# Patient Record
Sex: Female | Born: 1955 | Race: Black or African American | Hispanic: No | Marital: Married | Smoking: Never smoker
Health system: Southern US, Community
[De-identification: ages and names within clinical notes are randomized; demographics above are authoritative.]

## PROBLEM LIST (undated history)

## (undated) DIAGNOSIS — E1159 Type 2 diabetes mellitus with other circulatory complications: Secondary | ICD-10-CM

## (undated) DIAGNOSIS — E785 Hyperlipidemia, unspecified: Secondary | ICD-10-CM

## (undated) DIAGNOSIS — F419 Anxiety disorder, unspecified: Secondary | ICD-10-CM

## (undated) DIAGNOSIS — E119 Type 2 diabetes mellitus without complications: Secondary | ICD-10-CM

## (undated) DIAGNOSIS — F329 Major depressive disorder, single episode, unspecified: Secondary | ICD-10-CM

## (undated) DIAGNOSIS — F32A Depression, unspecified: Secondary | ICD-10-CM

## (undated) DIAGNOSIS — E1169 Type 2 diabetes mellitus with other specified complication: Secondary | ICD-10-CM

## (undated) DIAGNOSIS — M1990 Unspecified osteoarthritis, unspecified site: Secondary | ICD-10-CM

## (undated) DIAGNOSIS — I10 Essential (primary) hypertension: Secondary | ICD-10-CM

## (undated) DIAGNOSIS — Z794 Long term (current) use of insulin: Secondary | ICD-10-CM

## (undated) DIAGNOSIS — E1110 Type 2 diabetes mellitus with ketoacidosis without coma: Secondary | ICD-10-CM

## (undated) DIAGNOSIS — I152 Hypertension secondary to endocrine disorders: Secondary | ICD-10-CM

## (undated) HISTORY — DX: Type 2 diabetes mellitus with other circulatory complications: E11.59

## (undated) HISTORY — PX: ABDOMINAL HYSTERECTOMY: SHX81

## (undated) HISTORY — DX: Essential (primary) hypertension: I10

## (undated) HISTORY — DX: Anxiety disorder, unspecified: F41.9

## (undated) HISTORY — DX: Type 2 diabetes mellitus with other specified complication: E11.69

## (undated) HISTORY — DX: Type 2 diabetes mellitus without complications: E11.9

## (undated) HISTORY — PX: HERNIA REPAIR: SHX51

## (undated) HISTORY — DX: Hypertension secondary to endocrine disorders: I15.2

## (undated) HISTORY — DX: Unspecified osteoarthritis, unspecified site: M19.90

## (undated) HISTORY — DX: Type 2 diabetes mellitus with ketoacidosis without coma: E11.10

## (undated) HISTORY — DX: Major depressive disorder, single episode, unspecified: F32.9

## (undated) HISTORY — DX: Depression, unspecified: F32.A

## (undated) HISTORY — DX: Long term (current) use of insulin: E11.9

## (undated) HISTORY — DX: Type 2 diabetes mellitus without complications: Z79.4

## (undated) HISTORY — DX: Type 2 diabetes mellitus with other specified complication: E78.5

---

## 1999-06-11 ENCOUNTER — Encounter: Admission: RE | Admit: 1999-06-11 | Discharge: 1999-06-11 | Payer: Self-pay | Admitting: Cardiology

## 1999-06-11 ENCOUNTER — Encounter: Payer: Self-pay | Admitting: Cardiology

## 1999-07-27 ENCOUNTER — Other Ambulatory Visit: Admission: RE | Admit: 1999-07-27 | Discharge: 1999-07-27 | Payer: Self-pay | Admitting: Obstetrics and Gynecology

## 1999-07-28 ENCOUNTER — Other Ambulatory Visit: Admission: RE | Admit: 1999-07-28 | Discharge: 1999-07-28 | Payer: Self-pay | Admitting: Obstetrics and Gynecology

## 1999-08-03 ENCOUNTER — Encounter: Payer: Self-pay | Admitting: Obstetrics and Gynecology

## 1999-08-03 ENCOUNTER — Ambulatory Visit (HOSPITAL_COMMUNITY): Admission: RE | Admit: 1999-08-03 | Discharge: 1999-08-03 | Payer: Self-pay | Admitting: Obstetrics and Gynecology

## 1999-08-13 ENCOUNTER — Ambulatory Visit (HOSPITAL_COMMUNITY): Admission: RE | Admit: 1999-08-13 | Discharge: 1999-08-13 | Payer: Self-pay | Admitting: General Surgery

## 1999-12-11 ENCOUNTER — Inpatient Hospital Stay (HOSPITAL_COMMUNITY): Admission: AD | Admit: 1999-12-11 | Discharge: 1999-12-12 | Payer: Self-pay | Admitting: Obstetrics and Gynecology

## 2001-01-02 ENCOUNTER — Inpatient Hospital Stay (HOSPITAL_COMMUNITY): Admission: AD | Admit: 2001-01-02 | Discharge: 2001-01-04 | Payer: Self-pay | Admitting: Cardiology

## 2001-01-02 ENCOUNTER — Encounter: Payer: Self-pay | Admitting: Cardiology

## 2001-01-02 ENCOUNTER — Ambulatory Visit (HOSPITAL_COMMUNITY): Admission: RE | Admit: 2001-01-02 | Discharge: 2001-01-02 | Payer: Self-pay | Admitting: Cardiology

## 2001-07-02 ENCOUNTER — Ambulatory Visit (HOSPITAL_COMMUNITY): Admission: RE | Admit: 2001-07-02 | Discharge: 2001-07-02 | Payer: Self-pay | Admitting: General Surgery

## 2002-05-01 ENCOUNTER — Other Ambulatory Visit: Admission: RE | Admit: 2002-05-01 | Discharge: 2002-05-01 | Payer: Self-pay | Admitting: Obstetrics and Gynecology

## 2002-05-02 ENCOUNTER — Encounter (HOSPITAL_BASED_OUTPATIENT_CLINIC_OR_DEPARTMENT_OTHER): Payer: Self-pay | Admitting: General Surgery

## 2002-05-06 ENCOUNTER — Ambulatory Visit (HOSPITAL_COMMUNITY): Admission: RE | Admit: 2002-05-06 | Discharge: 2002-05-06 | Payer: Self-pay | Admitting: General Surgery

## 2002-06-06 ENCOUNTER — Encounter: Payer: Self-pay | Admitting: Obstetrics and Gynecology

## 2002-06-06 ENCOUNTER — Ambulatory Visit (HOSPITAL_COMMUNITY): Admission: RE | Admit: 2002-06-06 | Discharge: 2002-06-06 | Payer: Self-pay | Admitting: Obstetrics and Gynecology

## 2002-08-05 ENCOUNTER — Encounter: Payer: Self-pay | Admitting: Cardiology

## 2002-08-05 ENCOUNTER — Encounter: Admission: RE | Admit: 2002-08-05 | Discharge: 2002-08-05 | Payer: Self-pay | Admitting: Cardiology

## 2002-09-11 ENCOUNTER — Ambulatory Visit (HOSPITAL_BASED_OUTPATIENT_CLINIC_OR_DEPARTMENT_OTHER): Admission: RE | Admit: 2002-09-11 | Discharge: 2002-09-11 | Payer: Self-pay

## 2002-09-23 ENCOUNTER — Ambulatory Visit (HOSPITAL_COMMUNITY): Admission: AD | Admit: 2002-09-23 | Discharge: 2002-09-23 | Payer: Self-pay

## 2002-12-31 ENCOUNTER — Encounter: Admission: RE | Admit: 2002-12-31 | Discharge: 2003-03-31 | Payer: Self-pay | Admitting: Cardiology

## 2003-05-16 ENCOUNTER — Encounter: Admission: RE | Admit: 2003-05-16 | Discharge: 2003-05-16 | Payer: Self-pay | Admitting: Cardiology

## 2004-03-20 ENCOUNTER — Emergency Department (HOSPITAL_COMMUNITY): Admission: EM | Admit: 2004-03-20 | Discharge: 2004-03-20 | Payer: Self-pay | Admitting: *Deleted

## 2005-02-01 ENCOUNTER — Emergency Department (HOSPITAL_COMMUNITY): Admission: EM | Admit: 2005-02-01 | Discharge: 2005-02-01 | Payer: Self-pay | Admitting: Family Medicine

## 2005-02-02 ENCOUNTER — Emergency Department (HOSPITAL_COMMUNITY): Admission: EM | Admit: 2005-02-02 | Discharge: 2005-02-02 | Payer: Self-pay | Admitting: Family Medicine

## 2005-02-04 ENCOUNTER — Encounter: Admission: RE | Admit: 2005-02-04 | Discharge: 2005-02-04 | Payer: Self-pay | Admitting: Cardiology

## 2006-11-06 ENCOUNTER — Emergency Department: Payer: Self-pay | Admitting: Emergency Medicine

## 2009-01-06 ENCOUNTER — Ambulatory Visit: Payer: Self-pay | Admitting: Internal Medicine

## 2009-07-29 ENCOUNTER — Encounter: Payer: Self-pay | Admitting: Rheumatology

## 2009-08-02 ENCOUNTER — Encounter: Payer: Self-pay | Admitting: Rheumatology

## 2009-09-02 ENCOUNTER — Encounter: Payer: Self-pay | Admitting: Rheumatology

## 2010-02-21 ENCOUNTER — Emergency Department: Payer: Self-pay | Admitting: Emergency Medicine

## 2010-04-24 ENCOUNTER — Encounter: Payer: Self-pay | Admitting: Cardiology

## 2010-06-08 ENCOUNTER — Emergency Department: Payer: Self-pay | Admitting: General Practice

## 2010-11-16 ENCOUNTER — Ambulatory Visit: Payer: Self-pay | Admitting: General Practice

## 2011-06-04 ENCOUNTER — Ambulatory Visit: Payer: Self-pay | Admitting: Neurology

## 2011-07-05 ENCOUNTER — Ambulatory Visit: Payer: Self-pay | Admitting: Gastroenterology

## 2012-05-23 ENCOUNTER — Ambulatory Visit: Payer: Self-pay | Admitting: General Practice

## 2012-11-27 ENCOUNTER — Ambulatory Visit: Payer: Self-pay | Admitting: General Practice

## 2013-11-23 ENCOUNTER — Emergency Department: Payer: Self-pay | Admitting: Emergency Medicine

## 2013-11-23 LAB — URINALYSIS, COMPLETE
BILIRUBIN, UR: NEGATIVE
BLOOD: NEGATIVE
Bacteria: NONE SEEN
Glucose,UR: >=500 mg/dL (ref 0–75)
Ketone: NEGATIVE
Nitrite: NEGATIVE
Ph: 7 (ref 4.5–8.0)
Protein: 100
RBC,UR: NONE SEEN /HPF (ref 0–5)
Specific Gravity: 1.011 (ref 1.003–1.030)

## 2013-11-23 LAB — BASIC METABOLIC PANEL
ANION GAP: 12 (ref 7–16)
BUN: 26 mg/dL — ABNORMAL HIGH (ref 7–18)
CALCIUM: 9.3 mg/dL (ref 8.5–10.1)
CO2: 27 mmol/L (ref 21–32)
Chloride: 98 mmol/L (ref 98–107)
Creatinine: 1.56 mg/dL — ABNORMAL HIGH (ref 0.60–1.30)
EGFR (Non-African Amer.): 37 — ABNORMAL LOW
GFR CALC AF AMER: 43 — AB
Glucose: 423 mg/dL — ABNORMAL HIGH (ref 65–99)
OSMOLALITY: 297 (ref 275–301)
POTASSIUM: 3 mmol/L — AB (ref 3.5–5.1)
SODIUM: 137 mmol/L (ref 136–145)

## 2013-11-23 LAB — CBC
HCT: 43.3 % (ref 35.0–47.0)
HGB: 13.7 g/dL (ref 12.0–16.0)
MCH: 26.2 pg (ref 26.0–34.0)
MCHC: 31.8 g/dL — AB (ref 32.0–36.0)
MCV: 83 fL (ref 80–100)
Platelet: 433 10*3/uL (ref 150–440)
RBC: 5.24 10*6/uL — ABNORMAL HIGH (ref 3.80–5.20)
RDW: 15.4 % — AB (ref 11.5–14.5)
WBC: 11.2 10*3/uL — AB (ref 3.6–11.0)

## 2013-11-23 LAB — MAGNESIUM: MAGNESIUM: 2.2 mg/dL

## 2014-01-09 DIAGNOSIS — E119 Type 2 diabetes mellitus without complications: Secondary | ICD-10-CM | POA: Insufficient documentation

## 2014-01-09 DIAGNOSIS — E1169 Type 2 diabetes mellitus with other specified complication: Secondary | ICD-10-CM | POA: Insufficient documentation

## 2014-01-09 DIAGNOSIS — I471 Supraventricular tachycardia: Secondary | ICD-10-CM | POA: Insufficient documentation

## 2014-01-09 DIAGNOSIS — IMO0002 Reserved for concepts with insufficient information to code with codable children: Secondary | ICD-10-CM | POA: Insufficient documentation

## 2014-01-09 HISTORY — DX: Type 2 diabetes mellitus without complications: E11.9

## 2014-06-04 ENCOUNTER — Ambulatory Visit: Payer: Self-pay | Admitting: Endocrinology

## 2014-10-21 ENCOUNTER — Encounter: Payer: Self-pay | Admitting: Family Medicine

## 2014-10-21 ENCOUNTER — Other Ambulatory Visit (INDEPENDENT_AMBULATORY_CARE_PROVIDER_SITE_OTHER): Payer: PRIVATE HEALTH INSURANCE

## 2014-10-21 ENCOUNTER — Ambulatory Visit (INDEPENDENT_AMBULATORY_CARE_PROVIDER_SITE_OTHER): Payer: PRIVATE HEALTH INSURANCE | Admitting: Family Medicine

## 2014-10-21 ENCOUNTER — Ambulatory Visit (INDEPENDENT_AMBULATORY_CARE_PROVIDER_SITE_OTHER)
Admission: RE | Admit: 2014-10-21 | Discharge: 2014-10-21 | Disposition: A | Discharge status: Home / Self Care | Payer: PRIVATE HEALTH INSURANCE | Source: Ambulatory Visit | Attending: Family Medicine | Admitting: Family Medicine

## 2014-10-21 VITALS — BP 134/82 | HR 77 | Wt 197.0 lb

## 2014-10-21 DIAGNOSIS — R29898 Other symptoms and signs involving the musculoskeletal system: Secondary | ICD-10-CM | POA: Diagnosis not present

## 2014-10-21 DIAGNOSIS — M25511 Pain in right shoulder: Secondary | ICD-10-CM

## 2014-10-21 DIAGNOSIS — M75101 Unspecified rotator cuff tear or rupture of right shoulder, not specified as traumatic: Secondary | ICD-10-CM | POA: Insufficient documentation

## 2014-10-21 LAB — SEDIMENTATION RATE: SED RATE: 35 mm/h — AB (ref 0–22)

## 2014-10-21 LAB — C-REACTIVE PROTEIN: CRP: 0.5 mg/dL (ref 0.5–20.0)

## 2014-10-21 MED ORDER — MELOXICAM 15 MG PO TABS: 15.0000 mg | ORAL_TABLET | Freq: Every day | ORAL | Status: DC

## 2014-10-21 MED ORDER — GABAPENTIN 100 MG PO CAPS: 200.0000 mg | ORAL_CAPSULE | Freq: Every day | ORAL | Status: DC

## 2014-10-21 NOTE — Assessment & Plan Note (Addendum)
She does have significant weakness of the right arm. It does appear the patient does have a rotator cuff tear that could be contributing to it. Patient also though is having shaking of the arm and seems to have some mild neurologic component to it. Brachial neuritis, cervical radiculopathy, are within the differential. At this point we will get labs for workup to rule out any autoimmune disease or any inflammatory markers. X-ray of patient's shoulder and neck also ordered today to rule out any other bony abnormality I could be contribute in. Patient given gabapentin and meloxicam. We discussed prednisone but due to patient's blood sugars we went to avoid this. With the amount weakness the patient is having a do feel that advance imaging is warranted. She'll have an MRI of the shoulder as well as the neck. Patient's x-rays of her shoulder do show that there is a loose body that could also be contributing. Depending on findings we will discuss further follow-up.

## 2014-10-21 NOTE — Progress Notes (Signed)
Tawana ScaleZach Lissa Rowles D.O. [facility] [address] [place], [place] [address] Phone: [phone] Subjective:    CC: Right shoulder pain  BJY:[id number]:Subjective Lauren LarkGrace M Mueller is a 59 y.o. female coming in with complaint of right shoulder pain. Patient states that she has had this pain for 5-6 months. Patient states there may have been an injury while she was at work. Patient does work in a [employer]. Patient states she has had unfortunate pain more on the right arm as well as the right shoulder. Patient has noticed some mild weakness and has decreased range of motion of the shoulder. Patient states that certain movements and give her a sharp pain. Has noticed some numbness when she wakes up in the morning of the right arm as well. In addition of this patient has started noticing weakness in the hand especially with grasping things. Patient likely has been able to work because she is more of a Merchandiser, retailsupervisor and has not been doing any lifting at this time. Patient was given prednisone by primary care provider which did seem to help out significantly but unfortunately when she stopped the pain as well as the numbness came back. Patient is also noticed a tremor in the arm. Patient is becoming more and more concerned and states that this is starting to affect her daily activities. Rates the severity of pain is 8 out of 10 when it occurs.   Patient Active Problem List   Diagnosis Date Noted  . Right arm weakness 10/21/2014  . Type 2 diabetes mellitus with other specified complication 01/09/2014  . Adult BMI 30+ 01/09/2014  . Supraventricular tachycardia 01/09/2014  . Diabetes mellitus, type 2 01/09/2014    No past medical history on file. No past surgical history on file. History  Substance Use Topics  . Smoking status: Not on file  . Smokeless tobacco: Not on file  . Alcohol Use: Not on file   Not on File No family history on file. patient states that family history of rheumatoid  arthritis      Past medical history, social, surgical and family history all reviewed in electronic medical record.   Review of Systems: No headache, visual changes, nausea, vomiting, diarrhea, constipation, dizziness, abdominal pain, skin rash, fevers, chills, night sweats, weight loss, swollen lymph nodes, body aches, joint swelling, muscle aches, chest pain, shortness of breath, mood changes.   Objective Blood pressure 134/82, pulse 77, weight 197 lb (89.359 kg), SpO2 99 %.  General: No apparent distress alert and oriented x3 mood and affect normal, dressed appropriately. Obese HEENT: Pupils equal, extraocular movements intact  Respiratory: Patient's speak in full sentences and does not appear short of breath  Cardiovascular: No lower extremity edema, non tender, no erythema  Skin: Warm dry intact with no signs of infection or rash on extremities or on axial skeleton.  Abdomen: Soft nontender  Neuro: Cranial nerves II through XII are intact, neurovascularly intact in all extremities with 2+ DTRs and 2+ pulses.  Lymph: No lymphadenopathy of posterior or anterior cervical chain or axillae bilaterally.  Gait normal with good balance and coordination.  MSK:  Non tender with full range of motion and good stability and symmetric strength and tone of  elbows, wrist, hip, knee and ankles bilaterally.  Neck: Inspection unremarkable. No palpable stepoffs. Negative Spurling's maneuver. Full neck range of motion Grip strength and sensation normal in bilateral hands Significant loss of strength at C5-C7 with 3 out of 5 strength compared to the contralateral  side. Mild numbness from C5-T1 Negative Hoffman sign bilaterally +1 brachioradialis and biceps tendon compared to the contralateral side.  Shoulder: Right Inspection reveals no abnormalities, atrophy or asymmetry. Palpation is normal with no tenderness over AC joint or bicipital groove. ROM is full in all planes passively. Rotator cuff  strength normal throughout. signs of impingement with positive Neer and Hawkin's tests, but negative empty can sign. Speeds and Yergason's tests normal. No labral pathology noted with negative Obrien's, negative clunk and good stability. Normal scapular function observed. No painful arc and no drop arm sign. No apprehension sign  MSK US performed of: Right This study was ordered, performed, and interpreted by Terrilee Files D.O.  Shoulder:   Supraspinatus:  Appears patient may have a full-thickness tear or near full-thickness tear noted. Significant hypoechoic changes make it difficult to assess appropriately. Mild retraction noted of at least greater than 50% of the tendon. 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 AC joint:  Moderate arthritis Glenohumeral Joint:  Appears normal without effusion. Glenoid Labrum:  Degenerative changes noted Biceps Tendon:  Appears normal on long and transverse views, no fraying of tendon, tendon located in intertubercular groove, no subluxation with shoulder internal or external rotation.  Impression: Rotator cuff tear noted   Impression and Recommendations:     This case required medical decision making of moderate complexity.

## 2014-10-21 NOTE — Patient Instructions (Signed)
Good to see you.  Ice 20 minutes 2 times daily. Usually after activity and before bed. Gabapentin  at night meloxicam daily for 10 days then as needed Xrays downstairs today Labs today.  See me again next week and if no improvement we need MRI

## 2014-10-22 LAB — ANGIOTENSIN CONVERTING ENZYME: ANGIOTENSIN-CONVERTING ENZYME: 32 U/L (ref 8–52)

## 2014-10-22 LAB — ANA: ANA: NEGATIVE

## 2014-10-22 LAB — CYCLIC CITRUL PEPTIDE ANTIBODY, IGG: Cyclic Citrullin Peptide Ab: <2 U/mL (ref 0.0–5.0)

## 2014-10-29 ENCOUNTER — Ambulatory Visit
Admission: RE | Admit: 2014-10-29 | Discharge: 2014-10-29 | Disposition: A | Discharge status: Home / Self Care | Payer: PRIVATE HEALTH INSURANCE | Source: Ambulatory Visit | Attending: Family Medicine | Admitting: Family Medicine

## 2014-10-29 DIAGNOSIS — M75121 Complete rotator cuff tear or rupture of right shoulder, not specified as traumatic: Secondary | ICD-10-CM | POA: Diagnosis not present

## 2014-10-29 DIAGNOSIS — M5021 Other cervical disc displacement,  high cervical region: Secondary | ICD-10-CM | POA: Diagnosis not present

## 2014-10-29 DIAGNOSIS — M659 Synovitis and tenosynovitis, unspecified: Secondary | ICD-10-CM | POA: Insufficient documentation

## 2014-10-29 DIAGNOSIS — R29898 Other symptoms and signs involving the musculoskeletal system: Secondary | ICD-10-CM | POA: Diagnosis present

## 2014-10-29 DIAGNOSIS — M19011 Primary osteoarthritis, right shoulder: Secondary | ICD-10-CM | POA: Diagnosis not present

## 2014-10-30 ENCOUNTER — Ambulatory Visit (INDEPENDENT_AMBULATORY_CARE_PROVIDER_SITE_OTHER): Payer: PRIVATE HEALTH INSURANCE | Admitting: Family Medicine

## 2014-10-30 ENCOUNTER — Encounter: Payer: Self-pay | Admitting: Family Medicine

## 2014-10-30 VITALS — BP 126/80 | HR 79 | Wt 197.0 lb

## 2014-10-30 DIAGNOSIS — M5412 Radiculopathy, cervical region: Secondary | ICD-10-CM | POA: Insufficient documentation

## 2014-10-30 DIAGNOSIS — M75101 Unspecified rotator cuff tear or rupture of right shoulder, not specified as traumatic: Secondary | ICD-10-CM | POA: Diagnosis not present

## 2014-10-30 DIAGNOSIS — M542 Cervicalgia: Secondary | ICD-10-CM

## 2014-10-30 NOTE — Progress Notes (Signed)
Lauren Mueller [address] [place], [place] [address] Phone: [phone] Subjective:    CC: Right shoulder pain  [name] Lauren Mueller is a 59 y.o. female coming in with complaint of right shoulder pain. This is been going on for 5-6 months. Patient was found to have a rotator cuff tear on ultrasound and was sent for an MRI that has confirmed this with a full-thickness tear of the supraspinatus with retraction. Patient is also having neck pain as well as some weakness of C5-C7. There is concern of some cervical radiculopathy could also be playing a role. Patient and MRI of the neck that did show patient having C6 and C7 nerve root impingement as well as spinal stenosis at C5-C6 that is multifactorial.   Patient Active Problem List   Diagnosis Date Noted  . Right arm weakness 10/21/2014  . Right rotator cuff tear 10/21/2014  . Type 2 diabetes mellitus with other specified complication 01/09/2014  . Adult BMI 30+ 01/09/2014  . Supraventricular tachycardia 01/09/2014  . Diabetes mellitus, type 2 01/09/2014    No past medical history on file. No past surgical history on file. History  Substance Use Topics  . Smoking status: Never Smoker   . Smokeless tobacco: Not on file  . Alcohol Use: Not on file   Not on File No family history on file. patient states that family history of rheumatoid arthritis      Past medical history, social, surgical and family history all reviewed in electronic medical record.   Review of Systems: No headache, visual changes, nausea, vomiting, diarrhea, constipation, dizziness, abdominal pain, skin rash, fevers, chills, night sweats, weight loss, swollen lymph nodes, body aches, joint swelling, muscle aches, chest pain, shortness of breath, mood changes.   Objective Blood pressure 126/80, pulse 79, weight 197 lb (89.359 kg), SpO2 98 %.  General: No apparent distress alert and oriented x3 mood and affect normal, dressed  appropriately. Obese HEENT: Pupils equal, extraocular movements intact  Respiratory: Patient's speak in full sentences and does not appear short of breath  Cardiovascular: No lower extremity edema, non tender, no erythema  Skin: Warm dry intact with no signs of infection or rash on extremities or on axial skeleton.  Abdomen: Soft nontender  Neuro: Cranial nerves II through XII are intact, neurovascularly intact in all extremities with 2+ DTRs and 2+ pulses.  Lymph: No lymphadenopathy of posterior or anterior cervical chain or axillae bilaterally.  Gait normal with good balance and coordination.  MSK:  Non tender with full range of motion and good stability and symmetric strength and tone of  elbows, wrist, hip, knee and ankles bilaterally.  Neck: Inspection unremarkable. No palpable stepoffs. Negative Spurling's maneuver. Full neck range of motion Grip strength and sensation normal in bilateral hands Significant loss of strength at C5-C7 with 3 out of 5 strength compared to the contralateral side. Mild numbness from C5-T1 Negative Hoffman sign bilaterally +1 brachioradialis and biceps tendon compared to the contralateral side.  Shoulder: Right Inspection reveals no abnormalities, atrophy or asymmetry. Palpation is normal with no tenderness over AC joint or bicipital groove. ROM is full in all planes passively. Rotator cuff strength 3 out of 5 compared to full on the contralateral side signs of impingement with positive Neer and Hawkin's tests, but negative empty can sign. Speeds and Yergason's tests normal. No labral pathology noted with negative Obrien's, negative clunk and good stability. Normal scapular function observed. Positive drop arm test No apprehension sign  Impression and Recommendations:     This case required medical decision making of moderate complexity.

## 2014-10-30 NOTE — Assessment & Plan Note (Signed)
Patient does have significant radiculopathy and weakness of the upper extremity on the right side. Some of his weakness is secondary to the rotator cuff tear but I do feel that some of it is a nerve root impingement. I did not feel that EMG is warranted at this time. Patient will be set up for possible epidural of the cervical spine to see if this will help at least temporizing measure. Patient will be referred to neurosurgery for possible surgical intervention as well.  Spent  25 minutes with patient face-to-face and had greater than 50% of counseling including as described above in assessment and plan.

## 2014-10-30 NOTE — Assessment & Plan Note (Signed)
Patient does have a full-thickness tear that I do not think is going to heal appropriately. Patient will be referred to orthopedic surgery for further evaluation for surgical management. Due to patient's weakness of the C5 and C6 nerve roots on exam I am concerned that several radiculopathy is also playing a role and may be a more pressing issue. Patient will meet with the orthopedic surgeon in the near future. Patient can continue the attempts amatory's, icing protocol, and gabapentin the interim. Patient will call if she has any questions otherwise.

## 2014-10-30 NOTE — Progress Notes (Signed)
Pre visit review using our clinic review tool, if applicable. No additional management support is needed unless otherwise documented below in the visit note. 

## 2014-10-30 NOTE — Patient Instructions (Signed)
Good to see you Ice is your friend Continue the gabapentin Dr. Ave Filter for shoulder Dr. Yetta Barre or Dutch Quint for your neck and neurosurgery I am here for any questions.

## 2014-11-18 ENCOUNTER — Ambulatory Visit
Admission: RE | Admit: 2014-11-18 | Discharge: 2014-11-18 | Disposition: A | Discharge status: Home / Self Care | Payer: PRIVATE HEALTH INSURANCE | Source: Ambulatory Visit | Attending: Family Medicine | Admitting: Family Medicine

## 2014-11-18 DIAGNOSIS — M542 Cervicalgia: Secondary | ICD-10-CM

## 2014-11-18 MED ORDER — TRIAMCINOLONE ACETONIDE 40 MG/ML IJ SUSP (RADIOLOGY)
60.0000 mg | Freq: Once | INTRAMUSCULAR | Status: AC
  Administered 2014-11-18: 60 mg via EPIDURAL

## 2014-11-18 MED ORDER — IOHEXOL 300 MG/ML  SOLN
1.0000 mL | Freq: Once | INTRAMUSCULAR | Status: DC | PRN
  Administered 2014-11-18: 1 mL via EPIDURAL

## 2014-11-18 NOTE — Discharge Instructions (Signed)

## 2014-12-11 ENCOUNTER — Other Ambulatory Visit: Payer: Self-pay | Admitting: Physician Assistant

## 2014-12-11 DIAGNOSIS — Z1231 Encounter for screening mammogram for malignant neoplasm of breast: Secondary | ICD-10-CM

## 2014-12-18 ENCOUNTER — Ambulatory Visit: Payer: PRIVATE HEALTH INSURANCE | Attending: Physician Assistant

## 2015-01-05 ENCOUNTER — Encounter: Payer: Self-pay | Admitting: Physician Assistant

## 2015-01-05 ENCOUNTER — Ambulatory Visit: Payer: Self-pay | Admitting: Physician Assistant

## 2015-01-05 VITALS — BP 140/80 | HR 74 | Temp 98.5°F

## 2015-01-05 DIAGNOSIS — S61219A Laceration without foreign body of unspecified finger without damage to nail, initial encounter: Secondary | ICD-10-CM

## 2015-01-05 MED ORDER — CEPHALEXIN 500 MG PO CAPS: 500.0000 mg | ORAL_CAPSULE | Freq: Three times a day (TID) | ORAL | Status: DC

## 2015-01-05 MED ORDER — FLUCONAZOLE 150 MG PO TABS: 150.0000 mg | ORAL_TABLET | Freq: Once | ORAL | Status: DC

## 2015-01-05 MED ORDER — TETANUS-DIPHTH-ACELL PERTUSSIS 5-2.5-18.5 LF-MCG/0.5 IM SUSP
0.5000 mL | Freq: Once | INTRAMUSCULAR | Status: AC
  Administered 2015-01-05: 0.5 mL via INTRAMUSCULAR

## 2015-01-05 NOTE — Progress Notes (Signed)
S:  C/o laceration to left index finger, happened last night >8 hours ago, area still bleeding, was cutting cabbage and knife slipped, tdap is > 5 year, no loss of motion of finger, ros neg  O: vitals wnl, nad, lungs c t a, cv rrr, laceration to left index finger across distal and nail, small amount of bleeding noted, full rom of finger, n/v intact Procedure note: cleaned with sterile saline, area dried with gauze, applied steri strips and bandage, pt tolerated procedure well  A: laceration to left index finger with steri strip closure  P: keflex  tid x 7d, otc pain meds, keep finger as dry as possible, instructed patient and husband on how to use steri strips bc will need to be reapplied, return if any sign of infection or increased pain, if area not healing by Friday, pt instructed to return to clinic as do not feel she should be at work with open wound

## 2015-01-09 ENCOUNTER — Encounter: Payer: Self-pay | Admitting: Physician Assistant

## 2015-01-09 ENCOUNTER — Ambulatory Visit: Payer: Self-pay | Admitting: Physician Assistant

## 2015-01-09 VITALS — BP 130/90 | HR 73 | Temp 98.4°F

## 2015-01-09 DIAGNOSIS — S61221D Laceration with foreign body of left index finger without damage to nail, subsequent encounter: Secondary | ICD-10-CM

## 2015-01-09 NOTE — Progress Notes (Signed)
   Subjective:    Patient ID: Lauren Mueller, female    DOB: 07-14-1955, 59 y.o.   MRN: [id number]  HPI Patient here for follow up laceration left 2nd finger.  Seen previous but wound was too old to suture.  Patient wound was clean and bandage and place on Bactrim DS. States wound is still erythematous and edematous. Denies loss of function or sensation. Patient statres pain is 5/10/    Review of Systems    Hypertension, Diabetes, and GERD Objective:   Physical Exam No acute distress, healing laceration left index finger.  Mild edema and erythema. No visible discharge.N/V intact.         Assessment & Plan:  Healing left index finger laceration.  Continue previous medication and start Tramadol and Ibuprofen as directed. Follow up in 3 days .  Work note given.

## 2015-01-12 ENCOUNTER — Encounter: Payer: Self-pay | Admitting: Physician Assistant

## 2015-01-12 ENCOUNTER — Ambulatory Visit: Payer: Self-pay | Admitting: Physician Assistant

## 2015-01-12 VITALS — BP 118/70 | HR 77 | Temp 98.0°F

## 2015-01-12 DIAGNOSIS — B373 Candidiasis of vulva and vagina: Secondary | ICD-10-CM

## 2015-01-12 DIAGNOSIS — B3731 Acute candidiasis of vulva and vagina: Secondary | ICD-10-CM

## 2015-01-12 MED ORDER — FLUCONAZOLE 150 MG PO TABS: 150.0000 mg | ORAL_TABLET | Freq: Once | ORAL | Status: DC

## 2015-01-12 MED ORDER — TETANUS-DIPHTH-ACELL PERTUSSIS 5-2.5-18.5 LF-MCG/0.5 IM SUSP: 0.5000 mL | Freq: Once | INTRAMUSCULAR | Status: DC

## 2015-01-12 NOTE — Progress Notes (Signed)
S: recheck of finger, wound healing but still open , no fever/chills  O: afebrile, nad, skin with healing wound , serous drainage, n/v intact, no sign of infection  A:  Wound recheck  P: desk duty for 1 week, diflucan  , return if not healed in 1 week

## 2015-01-12 NOTE — Addendum Note (Signed)
Addended by: Catha Brow T on: 01/12/2015 03:15 PM   Modules accepted: Orders

## 2015-01-12 NOTE — Addendum Note (Signed)
Addended by: Catha Brow T on: 01/12/2015 03:34 PM   Modules accepted: Orders

## 2015-02-18 ENCOUNTER — Ambulatory Visit: Payer: Self-pay | Admitting: Family

## 2015-02-18 VITALS — BP 130/89 | HR 76 | Temp 97.8°F

## 2015-02-18 DIAGNOSIS — L03019 Cellulitis of unspecified finger: Secondary | ICD-10-CM

## 2015-02-18 DIAGNOSIS — M545 Low back pain: Secondary | ICD-10-CM

## 2015-02-18 DIAGNOSIS — N390 Urinary tract infection, site not specified: Secondary | ICD-10-CM

## 2015-02-18 LAB — POCT URINALYSIS DIPSTICK
BILIRUBIN UA: NEGATIVE
GLUCOSE UA: NEGATIVE
KETONES UA: NEGATIVE
Nitrite, UA: NEGATIVE
Protein, UA: NEGATIVE
RBC UA: NEGATIVE
SPEC GRAV UA: 1.015
Urobilinogen, UA: 0.2
pH, UA: 6.5

## 2015-02-18 MED ORDER — FLUCONAZOLE 150 MG PO TABS: 150.0000 mg | ORAL_TABLET | Freq: Once | ORAL | Status: DC

## 2015-02-18 MED ORDER — SULFAMETHOXAZOLE-TRIMETHOPRIM 800-160 MG PO TABS: 1.0000 | ORAL_TABLET | Freq: Two times a day (BID) | ORAL | Status: DC

## 2015-02-18 NOTE — Progress Notes (Signed)
S/ low back pain, stiffness , low abd pressure without GU sxs,   denies red flags,  Pain and swelling above laceration site   O/ VSS NAD, u/a with tr wbc Lumbar spine and paraspinous muscles tender full ROM  Abd SP tenderness   A/ LBP  UTI  cellulits P septra ds bid x 10 , Force po fluids . Back stretches and core work  Aleve 2 bid prn Soak finger in epsom salts f/u prn not improving

## 2015-05-08 ENCOUNTER — Other Ambulatory Visit: Payer: Self-pay | Admitting: Physician Assistant

## 2015-05-08 ENCOUNTER — Telehealth: Payer: Self-pay | Admitting: Family Medicine

## 2015-05-08 NOTE — Telephone Encounter (Signed)
[employer] sent request for disability.  Would like to know if it was received.

## 2015-05-11 NOTE — Telephone Encounter (Signed)
Pt called back and she will have [employer] re fax disability forms.

## 2015-05-14 ENCOUNTER — Telehealth: Payer: Self-pay | Admitting: Family Medicine

## 2015-05-14 NOTE — Telephone Encounter (Signed)
Called pt, advised her we still have not received those forms. i provided her with a different fax # in the office to see if this helps. Pt stated she is going to call [employer] & have them re-send the forms.

## 2015-05-14 NOTE — Telephone Encounter (Signed)
Wants to know if you have received information from [employer]

## 2015-05-14 NOTE — Telephone Encounter (Signed)
Duplicate phone note.

## 2015-05-18 ENCOUNTER — Telehealth: Payer: Self-pay | Admitting: Family Medicine

## 2015-05-18 NOTE — Telephone Encounter (Signed)
States disability fax was sent to fax number you gave patient.  Patient is requesting follow up call.

## 2015-05-20 NOTE — Telephone Encounter (Signed)
Spoke to pt & advised her we still have not received the paperwork. She gave me [employer]'s phone # so I could verify with them that they have the correct fax #. I called [employer] they do have our correct fax #. They stated they are waiting on the authorization form from the pt to give them permission to fax [place]. I called the pt back & gave her this information, she stated she has already sent the form several times. Pt stated she will call them again & re-submit the authorization form.

## 2015-05-20 NOTE — Telephone Encounter (Signed)
Patient called in to advise that [employer] has faxed in medical release information. She is requesting that we process this through, and that she spoke to you Thursday.

## 2015-06-08 ENCOUNTER — Telehealth: Payer: Self-pay | Admitting: Internal Medicine

## 2016-02-24 ENCOUNTER — Other Ambulatory Visit: Payer: Self-pay | Admitting: Orthopedic Surgery

## 2016-02-24 DIAGNOSIS — M1612 Unilateral primary osteoarthritis, left hip: Secondary | ICD-10-CM

## 2016-03-11 ENCOUNTER — Ambulatory Visit: Payer: BLUE CROSS/BLUE SHIELD

## 2016-03-18 ENCOUNTER — Ambulatory Visit (HOSPITAL_COMMUNITY)
Admission: RE | Admit: 2016-03-18 | Discharge: 2016-03-18 | Disposition: A | Discharge status: Home / Self Care | Payer: BLUE CROSS/BLUE SHIELD | Source: Ambulatory Visit | Attending: Orthopedic Surgery | Admitting: Orthopedic Surgery

## 2016-03-18 ENCOUNTER — Other Ambulatory Visit: Payer: Self-pay | Admitting: Orthopedic Surgery

## 2016-03-18 ENCOUNTER — Encounter (HOSPITAL_COMMUNITY): Payer: Self-pay

## 2016-03-18 DIAGNOSIS — M1612 Unilateral primary osteoarthritis, left hip: Secondary | ICD-10-CM

## 2016-03-18 DIAGNOSIS — S76812A Strain of other specified muscles, fascia and tendons at thigh level, left thigh, initial encounter: Secondary | ICD-10-CM | POA: Diagnosis not present

## 2016-09-14 ENCOUNTER — Encounter: Payer: Self-pay | Admitting: Emergency Medicine

## 2016-09-14 ENCOUNTER — Inpatient Hospital Stay
Admission: EM | Admit: 2016-09-14 | Discharge: 2016-09-15 | DRG: 638 | Disposition: A | Discharge status: Home / Self Care | Payer: PRIVATE HEALTH INSURANCE | Attending: Internal Medicine | Admitting: Internal Medicine

## 2016-09-14 DIAGNOSIS — E860 Dehydration: Secondary | ICD-10-CM | POA: Diagnosis present

## 2016-09-14 DIAGNOSIS — E1110 Type 2 diabetes mellitus with ketoacidosis without coma: Principal | ICD-10-CM | POA: Diagnosis present

## 2016-09-14 DIAGNOSIS — E876 Hypokalemia: Secondary | ICD-10-CM | POA: Diagnosis present

## 2016-09-14 DIAGNOSIS — I10 Essential (primary) hypertension: Secondary | ICD-10-CM | POA: Diagnosis present

## 2016-09-14 DIAGNOSIS — E871 Hypo-osmolality and hyponatremia: Secondary | ICD-10-CM | POA: Diagnosis present

## 2016-09-14 DIAGNOSIS — Z7984 Long term (current) use of oral hypoglycemic drugs: Secondary | ICD-10-CM

## 2016-09-14 DIAGNOSIS — Z791 Long term (current) use of non-steroidal anti-inflammatories (NSAID): Secondary | ICD-10-CM

## 2016-09-14 DIAGNOSIS — Z79899 Other long term (current) drug therapy: Secondary | ICD-10-CM

## 2016-09-14 DIAGNOSIS — Z9114 Patient's other noncompliance with medication regimen: Secondary | ICD-10-CM

## 2016-09-14 DIAGNOSIS — N179 Acute kidney failure, unspecified: Secondary | ICD-10-CM | POA: Diagnosis present

## 2016-09-14 HISTORY — DX: Type 2 diabetes mellitus with ketoacidosis without coma: E11.10

## 2016-09-14 LAB — BLOOD GAS, VENOUS
ACID-BASE EXCESS: 0.9 mmol/L (ref 0.0–2.0)
Bicarbonate: 29 mmol/L — ABNORMAL HIGH (ref 20.0–28.0)
PH VEN: 7.3 (ref 7.250–7.430)
Patient temperature: 37
pCO2, Ven: 59 mmHg (ref 44.0–60.0)
pO2, Ven: <31 mmHg — CL (ref 32.0–45.0)

## 2016-09-14 LAB — GLUCOSE, CAPILLARY
GLUCOSE-CAPILLARY: 472 mg/dL — AB (ref 65–99)
GLUCOSE-CAPILLARY: 369 mg/dL — AB (ref 65–99)
GLUCOSE-CAPILLARY: 338 mg/dL — AB (ref 65–99)
GLUCOSE-CAPILLARY: 260 mg/dL — AB (ref 65–99)
GLUCOSE-CAPILLARY: 133 mg/dL — AB (ref 65–99)
Glucose-Capillary: 582 mg/dL (ref 65–99)
Glucose-Capillary: 303 mg/dL — ABNORMAL HIGH (ref 65–99)
Glucose-Capillary: 212 mg/dL — ABNORMAL HIGH (ref 65–99)
Glucose-Capillary: 148 mg/dL — ABNORMAL HIGH (ref 65–99)
Glucose-Capillary: 132 mg/dL — ABNORMAL HIGH (ref 65–99)
Glucose-Capillary: 150 mg/dL — ABNORMAL HIGH (ref 65–99)

## 2016-09-14 LAB — CBC
HCT: 47.2 % — ABNORMAL HIGH (ref 35.0–47.0)
HCT: 47.5 % — ABNORMAL HIGH (ref 35.0–47.0)
Hemoglobin: 15.1 g/dL (ref 12.0–16.0)
Hemoglobin: 15.1 g/dL (ref 12.0–16.0)
MCH: 26.8 pg (ref 26.0–34.0)
MCH: 26.4 pg (ref 26.0–34.0)
MCHC: 31.9 g/dL — AB (ref 32.0–36.0)
MCHC: 31.7 g/dL — ABNORMAL LOW (ref 32.0–36.0)
MCV: 84.1 fL (ref 80.0–100.0)
MCV: 83.1 fL (ref 80.0–100.0)
PLATELETS: 433 10*3/uL (ref 150–440)
Platelets: 361 10*3/uL (ref 150–440)
RBC: 5.61 MIL/uL — AB (ref 3.80–5.20)
RBC: 5.71 MIL/uL — ABNORMAL HIGH (ref 3.80–5.20)
RDW: 15.3 % — ABNORMAL HIGH (ref 11.5–14.5)
RDW: 15 % — AB (ref 11.5–14.5)
WBC: 11.3 10*3/uL — ABNORMAL HIGH (ref 3.6–11.0)
WBC: 13.8 10*3/uL — AB (ref 3.6–11.0)

## 2016-09-14 LAB — BASIC METABOLIC PANEL
Anion gap: 17 — ABNORMAL HIGH (ref 5–15)
Anion gap: 11 (ref 5–15)
Anion gap: 8 (ref 5–15)
BUN: 37 mg/dL — ABNORMAL HIGH (ref 6–20)
BUN: 32 mg/dL — AB (ref 6–20)
BUN: 34 mg/dL — AB (ref 6–20)
CALCIUM: 9.1 mg/dL (ref 8.9–10.3)
CALCIUM: 8.9 mg/dL (ref 8.9–10.3)
CALCIUM: 8.6 mg/dL — AB (ref 8.9–10.3)
CHLORIDE: 91 mmol/L — AB (ref 101–111)
CO2: 23 mmol/L (ref 22–32)
CO2: 27 mmol/L (ref 22–32)
CO2: 27 mmol/L (ref 22–32)
CREATININE: 1.62 mg/dL — AB (ref 0.44–1.00)
CREATININE: 1.13 mg/dL — AB (ref 0.44–1.00)
CREATININE: 1.27 mg/dL — AB (ref 0.44–1.00)
Chloride: 103 mmol/L (ref 101–111)
Chloride: 103 mmol/L (ref 101–111)
GFR calc non Af Amer: 34 mL/min — ABNORMAL LOW (ref >60)
GFR, EST AFRICAN AMERICAN: 39 mL/min — AB (ref >60)
GFR, EST AFRICAN AMERICAN: 52 mL/min — AB (ref >60)
GFR, EST NON AFRICAN AMERICAN: 52 mL/min — AB (ref >60)
GFR, EST NON AFRICAN AMERICAN: 45 mL/min — AB (ref >60)
Glucose, Bld: 959 mg/dL (ref 65–99)
Glucose, Bld: 372 mg/dL — ABNORMAL HIGH (ref 65–99)
Glucose, Bld: 179 mg/dL — ABNORMAL HIGH (ref 65–99)
Potassium: 4.1 mmol/L (ref 3.5–5.1)
Potassium: 3.4 mmol/L — ABNORMAL LOW (ref 3.5–5.1)
Potassium: 3.4 mmol/L — ABNORMAL LOW (ref 3.5–5.1)
SODIUM: 141 mmol/L (ref 135–145)
SODIUM: 138 mmol/L (ref 135–145)
Sodium: 131 mmol/L — ABNORMAL LOW (ref 135–145)

## 2016-09-14 LAB — URINALYSIS, COMPLETE (UACMP) WITH MICROSCOPIC
BILIRUBIN URINE: NEGATIVE
Bacteria, UA: NONE SEEN
Glucose, UA: >=500 mg/dL — AB
KETONES UR: 5 mg/dL — AB
Nitrite: NEGATIVE
Protein, ur: 100 mg/dL — AB
Specific Gravity, Urine: 1.028 (ref 1.005–1.030)
pH: 6 (ref 5.0–8.0)

## 2016-09-14 LAB — MRSA PCR SCREENING: MRSA BY PCR: NEGATIVE

## 2016-09-14 MED ORDER — INSULIN REGULAR HUMAN 100 UNIT/ML IJ SOLN
INTRAMUSCULAR | Status: DC
  Administered 2016-09-14: 5.4 [IU]/h via INTRAVENOUS
  Filled 2016-09-14 (×2): qty 1

## 2016-09-14 MED ORDER — SODIUM CHLORIDE 0.9 % IV SOLN
INTRAVENOUS | Status: DC
  Administered 2016-09-14: 15:00:00 via INTRAVENOUS

## 2016-09-14 MED ORDER — DEXTROSE-NACL 5-0.45 % IV SOLN: INTRAVENOUS | Status: DC

## 2016-09-14 MED ORDER — ONDANSETRON HCL 4 MG/2ML IJ SOLN: 4.0000 mg | Freq: Four times a day (QID) | INTRAMUSCULAR | Status: DC | PRN

## 2016-09-14 MED ORDER — CARVEDILOL 25 MG PO TABS
25.0000 mg | ORAL_TABLET | Freq: Two times a day (BID) | ORAL | Status: DC
  Administered 2016-09-14 – 2016-09-15 (×2): 25 mg via ORAL
  Filled 2016-09-14: qty 1

## 2016-09-14 MED ORDER — SENNA 8.6 MG PO TABS
1.0000 | ORAL_TABLET | Freq: Two times a day (BID) | ORAL | Status: DC
  Administered 2016-09-14 – 2016-09-15 (×2): 8.6 mg via ORAL
  Filled 2016-09-14: qty 1

## 2016-09-14 MED ORDER — ACETAMINOPHEN 650 MG RE SUPP: 650.0000 mg | Freq: Four times a day (QID) | RECTAL | Status: DC | PRN

## 2016-09-14 MED ORDER — ACETAMINOPHEN 325 MG PO TABS: 650.0000 mg | ORAL_TABLET | Freq: Four times a day (QID) | ORAL | Status: DC | PRN

## 2016-09-14 MED ORDER — SODIUM CHLORIDE 0.9 % IV BOLUS (SEPSIS)
1000.0000 mL | Freq: Once | INTRAVENOUS | Status: AC
  Administered 2016-09-14: 1000 mL via INTRAVENOUS

## 2016-09-14 MED ORDER — ONDANSETRON HCL 4 MG PO TABS: 4.0000 mg | ORAL_TABLET | Freq: Four times a day (QID) | ORAL | Status: DC | PRN

## 2016-09-14 MED ORDER — HYDRALAZINE HCL 20 MG/ML IJ SOLN: 10.0000 mg | Freq: Four times a day (QID) | INTRAMUSCULAR | Status: DC | PRN

## 2016-09-14 MED ORDER — ENOXAPARIN SODIUM 40 MG/0.4ML ~~LOC~~ SOLN
40.0000 mg | SUBCUTANEOUS | Status: DC
  Administered 2016-09-14: 40 mg via SUBCUTANEOUS
  Filled 2016-09-14: qty 0.4

## 2016-09-14 MED ORDER — PANTOPRAZOLE SODIUM 40 MG PO TBEC: 40.0000 mg | DELAYED_RELEASE_TABLET | Freq: Every day | ORAL | Status: DC | PRN

## 2016-09-14 NOTE — Care Management (Signed)
RNCM consult placed to assist patient with discharge medication and establishment with PCP for uninsured if patient agrees. RNCM will follow up with patient.

## 2016-09-14 NOTE — ED Provider Notes (Signed)
[facility] Emergency Department Provider Note  ____________________________________________  Time seen: Approximately 11:35 AM  I have reviewed the triage vital signs and the nursing notes.   HISTORY  Chief Complaint Hyperglycemia   HPI Lauren Mueller is a 61 y.o. female with a history of type 2 diabetes on metformin and Viktosa, obesity, and hypertension who presents for evaluation of hyperglycemia. Patient reports that she has not been taking her diabetes medications for 3 months because she cannot afford it. She has had 2 days of multiple daily episodes of nonbloody nonbilious emesis however that resolved yesterday evening. Today she checked her blood glucose which was greater than 600 which prompted her visit to the emergency room. Patient reports polyuria, polydipsia, and feels like she is very dehydrated. No chest pain or shortness of breath, no confusion, no fever or chills, no longer vomiting, no diarrhea, no constipation, no dysuria.   History reviewed. No pertinent past medical history.  Patient Active Problem List   Diagnosis Date Noted  . Cervical radiculopathy at C5 10/30/2014  . Right arm weakness 10/21/2014  . Right rotator cuff tear 10/21/2014  . Type 2 diabetes mellitus with other specified complication (HCC) 01/09/2014  . Adult BMI 30+ 01/09/2014  . Supraventricular tachycardia (HCC) 01/09/2014  . Diabetes mellitus, type 2 (HCC) 01/09/2014  . Type 2 diabetes mellitus (HCC) 01/09/2014    No past surgical history on file.  Prior to Admission medications   Medication Sig Start Date End Date Taking? Authorizing Provider  amLODipine-olmesartan (AZOR) 5-40 MG per tablet Take by mouth.    [provider]  canagliflozin (INVOKANA) 100 MG TABS tablet Take by mouth. 01/17/14   [provider]  carvedilol (COREG) 6.25 MG tablet Take by mouth. 01/09/14 01/09/15  [provider]  cephALEXin (KEFLEX) 500 MG capsule Take 1  capsule (500 mg total) by mouth 3 (three) times daily. 01/05/15   Fisher, Roselyn BeringSusan W, PA-C  fluconazole (DIFLUCAN) 150 MG tablet Take 1 tablet (150 mg total) by mouth once. Take one now and one in 1 week 01/05/15   Sherrie MustacheFisher, Roselyn BeringSusan W, PA-C  fluconazole (DIFLUCAN) 150 MG tablet Take 1 tablet (150 mg total) by mouth once. 01/12/15   Sherrie MustacheFisher, Roselyn BeringSusan W, PA-C  fluconazole (DIFLUCAN) 150 MG tablet Take 1 tablet (150 mg total) by mouth once. May repeat in 2-3 days prn 02/18/15   Francesco SorMoore, Tommie Anne, NP  gabapentin (NEURONTIN) 100 MG capsule Take 2 capsules (200 mg total) by mouth at bedtime. 10/21/14   Judi SaaSmith, Zachary M, DO  hydrochlorothiazide (HYDRODIURIL) 12.5 MG tablet Take by mouth.    [provider]  Liraglutide (VICTOZA) 18 MG/3ML SOPN Inject into the skin.    [provider]  meloxicam (MOBIC) 15 MG tablet Take 1 tablet (15 mg total) by mouth daily. 10/21/14   Judi SaaSmith, Zachary M, DO  metformin (FORTAMET) 1000 MG (OSM) 24 hr tablet Take by mouth.    [provider]  omeprazole (PRILOSEC) 40 MG capsule Take by mouth. 01/09/14   [provider]  pioglitazone (ACTOS) 45 MG tablet Take by mouth.    [provider]  potassium chloride (K-DUR) 10 MEQ tablet Take by mouth. 01/09/14 01/09/15  [provider]  sulfamethoxazole-trimethoprim (BACTRIM DS,SEPTRA DS) 800-160 MG tablet Take 1 tablet by mouth 2 (two) times daily. 02/18/15   Francesco SorMoore, Tommie Anne, NP    Allergies Patient has no known allergies.  No family history on file.  Social History Social History  Substance Use  Topics  . Smoking status: Never Smoker  . Smokeless tobacco: Not on file  . Alcohol use Not on file    Review of Systems  Constitutional: Negative for fever. + dehydration Eyes: Negative for visual changes. ENT: Negative for sore throat. Neck: No neck pain  Cardiovascular: Negative for chest pain. Respiratory: Negative for shortness of breath. Gastrointestinal: Negative for abdominal  pain, diarrhea. + N/V Genitourinary: Negative for dysuria. Musculoskeletal: Negative for back pain. Skin: Negative for rash. Neurological: Negative for headaches, weakness or numbness. Psych: No SI or HI  ____________________________________________   PHYSICAL EXAM:  VITAL SIGNS: ED Triage Vitals  Enc Vitals Group     BP 09/14/16 1035 (!) 185/110     Pulse Rate 09/14/16 1035 (!) 122     Resp 09/14/16 1035 15     Temp 09/14/16 1035 98.1 F (36.7 C)     Temp Source 09/14/16 1035 Oral     SpO2 09/14/16 1035 97 %     Weight 09/14/16 1036 200 lb (90.7 kg)     Height 09/14/16 1036 5\' 3"  (1.6 m)     Head Circumference --      Peak Flow --      Pain Score 09/14/16 1032 0     Pain Loc --      Pain Edu? --      Excl. in GC? --     Constitutional: Alert and oriented. Well appearing and in no apparent distress. HEENT:      Head: Normocephalic and atraumatic.         Eyes: Conjunctivae are normal. Sclera is non-icteric.       Mouth/Throat: Mucous membranes are dry.       Neck: Supple with no signs of meningismus. Cardiovascular: Tachycardic with regular rhythm. No murmurs, gallops, or rubs. 2+ symmetrical distal pulses are present in all extremities. No JVD. Respiratory: Normal respiratory effort. Lungs are clear to auscultation bilaterally. No wheezes, crackles, or rhonchi.  Gastrointestinal: Soft, non tender, and non distended with positive bowel sounds. No rebound or guarding. Musculoskeletal: Nontender with normal range of motion in all extremities. No edema, cyanosis, or erythema of extremities. Neurologic: Normal speech and language. Face is symmetric. Moving all extremities. No gross focal neurologic deficits are appreciated. Skin: Skin is warm, dry and intact. No rash noted. Psychiatric: Mood and affect are normal. Speech and behavior are normal.  ____________________________________________   LABS (all labs ordered are listed, but only abnormal results are  displayed)  Labs Reviewed  BASIC METABOLIC PANEL - Abnormal; Notable for the following:       Result Value   Sodium 131 (*)    Chloride 91 (*)    Glucose, Bld 959 (*)    BUN 37 (*)    Creatinine, Ser 1.62 (*)    GFR calc non Af Amer 34 (*)    GFR calc Af Amer 39 (*)    Anion gap 17 (*)    All other components within normal limits  CBC - Abnormal; Notable for the following:    WBC 11.3 (*)    RBC 5.61 (*)    HCT 47.2 (*)    MCHC 31.9 (*)    RDW 15.3 (*)    All other components within normal limits  URINALYSIS, COMPLETE (UACMP) WITH MICROSCOPIC - Abnormal; Notable for the following:    Color, Urine STRAW (*)    APPearance CLEAR (*)    Glucose, UA >=500 (*)    Hgb urine dipstick SMALL (*)  Ketones, ur 5 (*)    Protein, ur 100 (*)    Leukocytes, UA TRACE (*)    Squamous Epithelial / LPF 0-5 (*)    All other components within normal limits  GLUCOSE, CAPILLARY - Abnormal; Notable for the following:    Glucose-Capillary >600 (*)    All other components within normal limits  BLOOD GAS, VENOUS  CBG MONITORING, ED   ____________________________________________  EKG  ED ECG REPORT I, Nita Sickle, the attending physician, personally viewed and interpreted this ECG.  Sinus tachycardia, rate of 102, normal intervals, normal axis, no ST elevations or depressions.  ____________________________________________  RADIOLOGY  none ____________________________________________   PROCEDURES  Procedure(s) performed: None Procedures Critical Care performed:  Yes  CRITICAL CARE Performed by: Nita Sickle  ?  Total critical care time: 35 min  Critical care time was exclusive of separately billable procedures and treating other patients.  Critical care was necessary to treat or prevent imminent or life-threatening deterioration.  Critical care was time spent personally by me on the following activities: development of treatment plan with patient and/or surrogate  as well as nursing, discussions with consultants, evaluation of patient's response to treatment, examination of patient, obtaining history from patient or surrogate, ordering and performing treatments and interventions, ordering and review of laboratory studies, ordering and review of radiographic studies, pulse oximetry and re-evaluation of patient's condition.  ____________________________________________   INITIAL IMPRESSION / ASSESSMENT AND PLAN / ED COURSE  61 y.o. female with a history of type 2 diabetes on metformin and Viktosa, obesity, and hypertension who presents for evaluation of hyperglycemia. Patient is well appearing, looks severely dehydrated with dry mucous membranes, and tachycardic with a pulse of 122. Her blood pressures elevated with no signs of hypotension. She has no complaints at this time other than dehydration. No obvious signs of infection based on history and exam. BMP showing a glucose of 959 with an anion gap of 17, normal bicarbonate. Creatinine at baseline at 1.62. Urine with ketones. VBG is pending. Patient will be given 2 L of IV fluid and started on insulin drip. We'll admit to the hospitalist service.     Pertinent labs & imaging results that were available during my care of the patient were reviewed by me and considered in my medical decision making (see chart for details).    ____________________________________________   FINAL CLINICAL IMPRESSION(S) / ED DIAGNOSES  Final diagnoses:  Diabetic ketoacidosis without coma associated with type 2 diabetes mellitus (HCC)      NEW MEDICATIONS STARTED DURING THIS VISIT:  New Prescriptions   No medications on file     Note:  This document was prepared using Dragon voice recognition software and may include unintentional dictation errors.    Don Perking, [place], MD 09/14/16 [phone]

## 2016-09-14 NOTE — Progress Notes (Signed)
eLink Physician-Brief Progress Note Patient Name: Lauren LarkGrace M Mueller DOB: 10/17/1955 MRN: [id number]   Date of Service  09/14/2016  HPI/Events of Note  Question about DKA vs Hyperglycemia IV insulin protocol - No urine ketone result on UA earlier today.   eICU Interventions  Will ask bedside nurse to check urine ketone result with lab or resend.      Intervention Category Major Interventions: Hyperglycemia - active titration of insulin therapy  Lenell AntuSommer,Steven Eugene 09/14/2016, 3:46 PM

## 2016-09-14 NOTE — Consult Note (Signed)
[facility] [place] [facility] Consultation      Assessment and Plan:  Anion gap metabolic acidosis, which may be consistent with diabetic ketoacidosis. - DKA protocol, will transition to subcutaneous insulin when anion gap has resolved.  Acute kidney injury, likely prerenal secondary to above. -continue IV fluids, continue to monitor renal function and urine output.  Diabetes mellitus, not currently taking medications due to lack of insurance coverage. -we'll consult care management to help with access to medications.  Date: 09/14/2016  MRN# [id number] Lauren Mueller 10-Sep-1955  Referring Physician: Dr. Allena Katz for DKA.   Lauren Mueller is a 61 y.o. old female seen in consultation for chief complaint of:    Chief Complaint  Patient presents with  . Hyperglycemia    HPI:   The patient is a66 year old female, she notes lack of insurance coverage. Therefore, she cannot get her diabetes medications. She ran out of her medications approximately 3 months ago, over the last few days she has been having nausea and vomiting.she checked her blood glucose at home, which was no to be greater than 600, therefore, she came to the [facility] department. On review of her metabolic panel, her blood glucose is 959, creatinine is 1.6 to consistent with acute kidney injury.anion gap is elevated at 17.     PMHX:   DM Surgical Hx:  No past surgical history on file. Family Hx:  No family history on file. Social Hx:   Social History  Substance Use Topics  . Smoking status: Never Smoker  . Smokeless tobacco: Not on file  . Alcohol use Not on file   Medication:    Current Facility-Administered Medications:  .  0.9 %  sodium chloride infusion, , Intravenous, Continuous, Enedina Finner, MD, Last Rate: 125 mL/hr at 09/14/16 1500 .  acetaminophen (TYLENOL) tablet 650 mg, 650 mg, Oral, Q6H PRN **OR** acetaminophen (TYLENOL) suppository 650 mg, 650 mg, Rectal, Q6H PRN, Enedina Finner, MD .  carvedilol  (COREG) tablet 25 mg, 25 mg, Oral, BID WC, Patel, Sona, MD .  dextrose 5 %-0.45 % sodium chloride infusion, , Intravenous, Continuous, Don Perking, [place], MD, Stopped at 09/14/16 1130 .  enoxaparin (LOVENOX) injection 40 mg, 40 mg, Subcutaneous, Q24H, Enedina Finner, MD .  hydrALAZINE (APRESOLINE) injection 10 mg, 10 mg, Intravenous, Q6H PRN, Enedina Finner, MD .  insulin regular (NOVOLIN R,HUMULIN R) 100 Units in sodium chloride 0.9 % 100 mL (1 Units/mL) infusion, , Intravenous, Continuous, Veronese, [place], MD, Last Rate: 8.2 mL/hr at 09/14/16 1500 .  ondansetron (ZOFRAN) tablet 4 mg, 4 mg, Oral, Q6H PRN **OR** ondansetron (ZOFRAN) injection 4 mg, 4 mg, Intravenous, Q6H PRN, Enedina Finner, MD .  pantoprazole (PROTONIX) EC tablet 40 mg, 40 mg, Oral, Daily PRN, Enedina Finner, MD .  senna (SENOKOT) tablet 8.6 mg, 1 tablet, Oral, BID, Enedina Finner, MD   Allergies:  Patient has no known allergies.  Review of Systems: Gen:  Denies  fever, sweats, chills HEENT: Denies blurred vision, double vision. bleeds, sore throat Cvc:  No dizziness, chest pain. Resp:   Denies cough or sputum production, shortness of breath Gi: Denies swallowing difficulty, stomach pain. Gu:  Denies bladder incontinence, burning urine Ext:   No Joint pain, stiffness. Skin: No skin rash,  hives  Endoc:  No polyuria, polydipsia. Psych: No depression, insomnia. Other:  All other systems were reviewed with the patient and were negative other that what is mentioned in the HPI.   Physical Examination:   VS: BP (!) 167/103 (BP Location: Left  Arm)   Pulse (!) 102   Temp 98.8 F (37.1 C) (Oral)   Resp 18   Ht 5\' 3"  (1.6 m)   Wt 84.4 kg (186 lb 1.1 oz)   SpO2 95%   BMI 32.96 kg/m   General Appearance: No distress  Neuro:without focal findings,  speech normal,  HEENT: PERRLA, EOM intact.   Pulmonary: normal breath sounds, No wheezing.  CardiovascularNormal S1,S2.  No m/r/g.   Abdomen: Benign, Soft, non-tender. Renal:  No  costovertebral tenderness  GU:  No performed at this time. Endoc: No evident thyromegaly, no signs of acromegaly. Skin:   warm, no rashes, no ecchymosis  Extremities: normal, no cyanosis, clubbing.  Other findings:    LABORATORY PANEL:   CBC  Recent Labs Lab 09/14/16 1032  WBC 11.3*  HGB 15.1  HCT 47.2*  PLT 433   ------------------------------------------------------------------------------------------------------------------  Chemistries   Recent Labs Lab 09/14/16 1032  NA 131*  K 4.1  CL 91*  CO2 23  GLUCOSE 959*  BUN 37*  CREATININE 1.62*  CALCIUM 9.1   ------------------------------------------------------------------------------------------------------------------  Cardiac Enzymes No results for input(s): TROPONINI in the last 168 hours. ------------------------------------------------------------  RADIOLOGY:  No results found.     Thank  you for the consultation and for allowing [facility] to assist in the care of your patient. Our recommendations are noted above.  Please contact us if we can be of further service.   Wells Guileseep Maia Handa, MD.  Board Certified in Internal Medicine, Pulmonary Medicine, Critical Care Medicine, and Sleep Medicine.  [facility] Office Number: [phone]  Santiago Gladavid Kasa, M.D.  Billy Fischeravid Simonds, M.D  09/14/2016

## 2016-09-14 NOTE — Progress Notes (Signed)
Inpatient Diabetes Program Recommendations  AACE/ADA: New Consensus Statement on Inpatient Glycemic Control (2015)  Target Ranges:  Prepandial:   less than 140 mg/dL      Peak postprandial:   less than 180 mg/dL (1-2 hours)      Critically ill patients:  140 - 180 mg/dL   Results for [name], Ronasia M (MRN [id number]) as of 09/14/2016 12:49  Ref. Range 09/14/2016 10:32  Glucose Latest Ref Range: 65 - 99 mg/dL [id number] (HH)   Review of Glycemic Control  Diabetes history: DM2 Outpatient Diabetes medications: Actos 45 mg daily, Amaryl 2 mg daily, Victoza 1.8 mg daily Current orders for Inpatient glycemic control: IV insulin drip  Inpatient Diabetes Program Recommendations:  Insulin - IV drip/GlucoStabilizer: Agree with IV insulin drip at this time. HgbA1C: Please consider ordering an A1C to evaluate glycemic control over the past 2-3 months.  NOTE: Noted consult for Diabetes Coordinator. Chart reviewed. Patient is currently still in the ER and has been started on IV insulin drip due to hyperglycemia. Per Dr. Don PerkingVeronese note today "Patient reports that she has not been taking her diabetes medications for 3 months because she cannot afford it." Agree with using IV insulin for glycemic control at this time. Will continue to follow along and make further recommendations as more data is collected.  Thanks, Orlando PennerMarie Phebe Dettmer, RN, MSN, CDE Diabetes Coordinator Inpatient Diabetes Program [phone] (Team Pager from 8am to 5pm)

## 2016-09-14 NOTE — Care Management Note (Signed)
Case Management Note  Patient Details  Name: Lauren Mueller MRN: [id number] Date of Birth: October 05, 1955  Subjective/Objective:                  Met with patient in SDU/ICU to discuss discharge planning. She states she lives with her husband and three adult children also live with her. She states she sometimes uses a cane for ambulation but typically she is independent with mobility. She is able to drive. Her PCP is Dr. Frazier Richards with [facility]. She has a working glucometer at home. She struggles to pay for her diabetic medications and has thus missed approximately 3 months worth. She agrees to referral to [facility] and [facility].  Action/Plan: Referrals to both Medication Management and [facility]; applications delivered to patient.  I have explained hours of operation. RNCM to follow for Medication assistance.   Expected Discharge Date:                  Expected Discharge Plan:     In-House Referral:     Discharge planning Services  CM Consult, Medication Assistance, [facility]  Post Acute Care Choice:    Choice offered to:  Patient  DME Arranged:    DME Agency:     HH Arranged:    [place] Agency:     Status of Service:  In process, will continue to follow  If discussed at Long Length of Stay Meetings, dates discussed:    Additional Comments:  Marshell Garfinkel, RN 09/14/2016, 2:55 PM

## 2016-09-14 NOTE — H&P (Signed)
[facility] - [place] at [facility]   PATIENT NAME: Lauren Mueller    MR#:  [id number]  DATE OF BIRTH:  Oct 11, 1955  DATE OF ADMISSION:  09/14/2016  PRIMARY CARE PHYSICIAN: Lauro Regulus, MD   REQUESTING/REFERRING PHYSICIAN: Dr. Don Perking  CHIEF COMPLAINT:   Nausea and vomiting since yesterday and elevated sugars more than 600. HISTORY OF PRESENT ILLNESS:  Lauren Mueller  is a 61 y.o. female with a known history of Type 2 diabetes, hypertension, cervical radiculopathy at C5 comes to the emergency room with intractable vomiting since yesterday. Patient has not vomited since morning. She checked her sugars and it has been more than 600. She's been complaining of feeling significantly thirsty and polydipsia. She has not taken her diabetes medicines were last 3 months secondary to loss of insurance and not able to afford it.  Patient was found to be dehydrated and in acute DKA with type 2 diabetes. She's been given IV fluids and started on insulin drip. Her anion gap is 17. Sugars were 956. She is being admitted for further evaluation and management.  ICU attending Dr. Debria Garret and is aware of patient being admitted.  PAST MEDICAL HISTORY:  History reviewed. No pertinent past medical history.  PAST SURGICAL HISTOIRY:  No past surgical history on file.  SOCIAL HISTORY:   Social History  Substance Use Topics  . Smoking status: Never Smoker  . Smokeless tobacco: Not on file  . Alcohol use Not on file    FAMILY HISTORY:  No family history on file.  DRUG ALLERGIES:  No Known Allergies  REVIEW OF SYSTEMS:  Review of Systems  Constitutional: Negative for chills, fever and weight loss.  HENT: Negative for ear discharge, ear pain and nosebleeds.   Eyes: Negative for blurred vision, pain and discharge.  Respiratory: Negative for sputum production, shortness of breath, wheezing and stridor.   Cardiovascular: Negative for chest pain, palpitations, orthopnea  and PND.  Gastrointestinal: Positive for nausea and vomiting. Negative for abdominal pain and diarrhea.  Genitourinary: Negative for frequency and urgency.  Musculoskeletal: Negative for back pain and joint pain.  Neurological: Positive for weakness. Negative for sensory change, speech change and focal weakness.  Psychiatric/Behavioral: Negative for depression and hallucinations. The patient is not nervous/anxious.      MEDICATIONS AT HOME:   Prior to Admission medications   Medication Sig Start Date End Date Taking? Authorizing Provider  acetaminophen (TYLENOL) 500 MG tablet Take 500 mg by mouth every 6 (six) hours as needed.   Yes [provider]  glimepiride (AMARYL) 2 MG tablet Take 2 mg by mouth daily. 04/29/16 04/29/17 Yes [provider]  meloxicam (MOBIC) 15 MG tablet Take 1 tablet (15 mg total) by mouth daily. 10/21/14  Yes Antoine Primas M, DO  metformin (FORTAMET) 1000 MG (OSM) 24 hr tablet Take 1,000 mg by mouth daily with breakfast.    Yes [provider]  omeprazole (PRILOSEC) 40 MG capsule Take 40 mg by mouth daily.  01/09/14  Yes [provider]  pioglitazone (ACTOS) 45 MG tablet Take 45 mg by mouth daily.    Yes [provider]  carvedilol (COREG) 6.25 MG tablet Take 12.5 mg by mouth daily.  01/09/14 01/09/15  [provider]  gabapentin (NEURONTIN) 100 MG capsule Take 2 capsules (200 mg total) by mouth at bedtime. Patient not taking: Reported on 09/14/2016 10/21/14   Judi Saa, DO  hydrochlorothiazide (HYDRODIURIL) 12.5 MG tablet Take 12.5 mg by mouth daily.  [provider]  Liraglutide (VICTOZA) 18 MG/3ML SOPN Inject 18 mg into the skin daily.     [provider]  potassium chloride (K-DUR) 10 MEQ tablet Take 10 mEq by mouth daily.  01/09/14 01/09/15  [provider]      VITAL SIGNS:  Blood pressure (!) 156/91, pulse (!) 105, temperature 98.8 F (37.1 C), temperature source Oral, resp.  rate 11, height 5\' 3"  (1.6 m), weight 84.4 kg (186 lb 1.1 oz), SpO2 99 %.  PHYSICAL EXAMINATION:  GENERAL:  61 y.o.-year-old patient lying in the bed with no acute distress. obese EYES: Pupils equal, round, reactive to light and accommodation. No scleral icterus. Extraocular muscles intact.  HEENT: Head atraumatic, normocephalic. Oropharynx and nasopharynx clear.  NECK:  Supple, no jugular venous distention. No thyroid enlargement, no tenderness.  LUNGS: Normal breath sounds bilaterally, no wheezing, rales,rhonchi or crepitation. No use of accessory muscles of respiration.  CARDIOVASCULAR: S1, S2 normal. No murmurs, rubs, or gallops.  ABDOMEN: Soft, nontender, nondistended. Bowel sounds present. No organomegaly or mass.  EXTREMITIES: No pedal edema, cyanosis, or clubbing.  NEUROLOGIC: Cranial nerves II through XII are intact. Muscle strength 5/5 in all extremities. Sensation intact. Gait not checked.  PSYCHIATRIC:  patient is alert and oriented x 3.  SKIN: No obvious rash, lesion, or ulcer.   LABORATORY PANEL:   CBC  Recent Labs Lab 09/14/16 1032  WBC 11.3*  HGB 15.1  HCT 47.2*  PLT 433   ------------------------------------------------------------------------------------------------------------------  Chemistries   Recent Labs Lab 09/14/16 1032  NA 131*  K 4.1  CL 91*  CO2 23  GLUCOSE 959*  BUN 37*  CREATININE 1.62*  CALCIUM 9.1   ------------------------------------------------------------------------------------------------------------------  Cardiac Enzymes No results for input(s): TROPONINI in the last 168 hours. ------------------------------------------------------------------------------------------------------------------  RADIOLOGY:  No results found.  EKG:    IMPRESSION AND PLAN:   Lauren Mueller  is a 61 y.o. female with a known history of Type 2 diabetes, hypertension, cervical radiculopathy at C5 comes to the emergency room with intractable  vomiting since yesterday. Patient has not vomited since morning. She checked her sugars and it has been more than 600.   1. DKA in pt with Type 2 DM with noncompliance of medications secondary to financial constraint. Patient has been off diabetes meds for last 3 months -Admit to ICU stepdown -IV fluids, IV insulin drip DKA protocol -ICU attending aware of patient being admitted -Follow up medically every 6 hours  2. Type 2 diabetes uncontrolled secondary to medication noncompliance with patient not able to afford due to loss of insurance and job -Hemoglobin A1c -Patient would benefit from insulin Lantus and sliding scale at home along with oral metformin -Care management for discharge planning  3. Acute renal failure with acute hyponatremia in the setting of DKA and dehydration -IV fluids -Follow up I's and O's and creatinine  4. Malignant hypertension -Patient again not able to afford meds -We'll resume Coreg 25 twice a day and when necessary hydralazine  5. DVT prophylaxis subcutaneous Lovenox  All the records are reviewed and case discussed with ED provider. Management plans discussed with the patient, family and they are in agreement.  CODE STATUS: FULL  TOTAL critical TIME TAKING CARE OF THIS PATIENT: 50 minutes.    Zian Delair M.D on 09/14/2016 at 2:56 PM  Between 7am to 6pm - Pager - [phone]  After 6pm go to [email or web address] - password [email or web address] [facility]  [facility]  Office  [phone]  CC: Primary care physician; Einar CrowAnderson, [place]  Viona Gilmore, MD

## 2016-09-14 NOTE — ED Triage Notes (Signed)
Pt reports CBG over 600 today, has been out of metformin and victoza for 3 months. Reports decreased appetite x2 days with vomiting.

## 2016-09-14 NOTE — Progress Notes (Signed)
No past medical history in chart. Discussed past medical history with patient. 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 She has had 3 cesarean sections, partial hysterectomy, and a hernia repair. Patient also shared about a year ago she had to come to the ED for a 'racing heart'. She remembers having to be on a drip to control her heart rate. She did not remember the details of what rhythm she was in. She has not had any cardiac issues since then.

## 2016-09-15 LAB — GLUCOSE, CAPILLARY
GLUCOSE-CAPILLARY: 119 mg/dL — AB (ref 65–99)
Glucose-Capillary: 139 mg/dL — ABNORMAL HIGH (ref 65–99)
Glucose-Capillary: 151 mg/dL — ABNORMAL HIGH (ref 65–99)
Glucose-Capillary: 174 mg/dL — ABNORMAL HIGH (ref 65–99)
Glucose-Capillary: 189 mg/dL — ABNORMAL HIGH (ref 65–99)
Glucose-Capillary: 266 mg/dL — ABNORMAL HIGH (ref 65–99)

## 2016-09-15 LAB — BASIC METABOLIC PANEL
ANION GAP: 8 (ref 5–15)
ANION GAP: 7 (ref 5–15)
Anion gap: 7 (ref 5–15)
BUN: 35 mg/dL — ABNORMAL HIGH (ref 6–20)
BUN: 31 mg/dL — ABNORMAL HIGH (ref 6–20)
BUN: 30 mg/dL — ABNORMAL HIGH (ref 6–20)
CHLORIDE: 106 mmol/L (ref 101–111)
CHLORIDE: 104 mmol/L (ref 101–111)
CO2: 24 mmol/L (ref 22–32)
CO2: 26 mmol/L (ref 22–32)
CO2: 25 mmol/L (ref 22–32)
CREATININE: 1.21 mg/dL — AB (ref 0.44–1.00)
Calcium: 8.5 mg/dL — ABNORMAL LOW (ref 8.9–10.3)
Calcium: 8.7 mg/dL — ABNORMAL LOW (ref 8.9–10.3)
Calcium: 8.8 mg/dL — ABNORMAL LOW (ref 8.9–10.3)
Chloride: 108 mmol/L (ref 101–111)
Creatinine, Ser: 1 mg/dL (ref 0.44–1.00)
Creatinine, Ser: 1.03 mg/dL — ABNORMAL HIGH (ref 0.44–1.00)
GFR, EST AFRICAN AMERICAN: 56 mL/min — AB (ref >60)
GFR, EST NON AFRICAN AMERICAN: 48 mL/min — AB (ref >60)
GFR, EST NON AFRICAN AMERICAN: 58 mL/min — AB (ref >60)
Glucose, Bld: 188 mg/dL — ABNORMAL HIGH (ref 65–99)
Glucose, Bld: 269 mg/dL — ABNORMAL HIGH (ref 65–99)
Glucose, Bld: 297 mg/dL — ABNORMAL HIGH (ref 65–99)
POTASSIUM: 3.3 mmol/L — AB (ref 3.5–5.1)
POTASSIUM: 3.7 mmol/L (ref 3.5–5.1)
Potassium: 3.4 mmol/L — ABNORMAL LOW (ref 3.5–5.1)
SODIUM: 140 mmol/L (ref 135–145)
SODIUM: 139 mmol/L (ref 135–145)
SODIUM: 136 mmol/L (ref 135–145)

## 2016-09-15 LAB — CBC
HCT: 40.9 % (ref 35.0–47.0)
HEMOGLOBIN: 13.2 g/dL (ref 12.0–16.0)
MCH: 26.7 pg (ref 26.0–34.0)
MCHC: 32.3 g/dL (ref 32.0–36.0)
MCV: 82.6 fL (ref 80.0–100.0)
PLATELETS: 329 10*3/uL (ref 150–440)
RBC: 4.95 MIL/uL (ref 3.80–5.20)
RDW: 15.2 % — ABNORMAL HIGH (ref 11.5–14.5)
WBC: 8.8 10*3/uL (ref 3.6–11.0)

## 2016-09-15 LAB — HEMOGLOBIN A1C
Hgb A1c MFr Bld: 13.5 % — ABNORMAL HIGH (ref 4.8–5.6)
Mean Plasma Glucose: 341 mg/dL

## 2016-09-15 MED ORDER — POTASSIUM CHLORIDE CRYS ER 20 MEQ PO TBCR
40.0000 meq | EXTENDED_RELEASE_TABLET | Freq: Once | ORAL | Status: AC
  Administered 2016-09-15: 40 meq via ORAL

## 2016-09-15 MED ORDER — INSULIN ASPART 100 UNIT/ML ~~LOC~~ SOLN: 4.0000 [IU] | Freq: Three times a day (TID) | SUBCUTANEOUS | 0 refills | Status: DC

## 2016-09-15 MED ORDER — INSULIN ASPART 100 UNIT/ML ~~LOC~~ SOLN
0.0000 [IU] | Freq: Three times a day (TID) | SUBCUTANEOUS | Status: DC
  Administered 2016-09-15: 08:00:00 3 [IU] via SUBCUTANEOUS
  Administered 2016-09-15: 8 [IU] via SUBCUTANEOUS
  Filled 2016-09-15: qty 8

## 2016-09-15 MED ORDER — LISINOPRIL 5 MG PO TABS
ORAL_TABLET | ORAL | Status: AC
  Filled 2016-09-15: qty 1

## 2016-09-15 MED ORDER — LISINOPRIL 5 MG PO TABS
5.0000 mg | ORAL_TABLET | Freq: Every day | ORAL | Status: DC
Start: 2016-09-15 — End: 2016-09-15
  Administered 2016-09-15: 5 mg via ORAL

## 2016-09-15 MED ORDER — INSULIN DETEMIR 100 UNIT/ML ~~LOC~~ SOLN: 10.0000 [IU] | Freq: Every day | SUBCUTANEOUS | 0 refills | Status: DC

## 2016-09-15 MED ORDER — INSULIN DETEMIR 100 UNIT/ML ~~LOC~~ SOLN
10.0000 [IU] | Freq: Every day | SUBCUTANEOUS | Status: DC
  Administered 2016-09-15: 13:00:00 10 [IU] via SUBCUTANEOUS
  Filled 2016-09-15 (×2): qty 0.1

## 2016-09-15 MED ORDER — SENNA 8.6 MG PO TABS
ORAL_TABLET | ORAL | Status: AC
  Filled 2016-09-15: qty 1

## 2016-09-15 MED ORDER — CARVEDILOL 25 MG PO TABS
ORAL_TABLET | ORAL | Status: AC
  Filled 2016-09-15: qty 1

## 2016-09-15 MED ORDER — INSULIN ASPART 100 UNIT/ML ~~LOC~~ SOLN
SUBCUTANEOUS | Status: AC
  Filled 2016-09-15: qty 3

## 2016-09-15 MED ORDER — HYDROCHLOROTHIAZIDE 25 MG PO TABS
25.0000 mg | ORAL_TABLET | Freq: Every day | ORAL | Status: DC
  Administered 2016-09-15: 25 mg via ORAL

## 2016-09-15 MED ORDER — INSULIN GLARGINE 100 UNIT/ML ~~LOC~~ SOLN
10.0000 [IU] | SUBCUTANEOUS | Status: DC
  Administered 2016-09-15: 10 [IU] via SUBCUTANEOUS
  Filled 2016-09-15 (×2): qty 0.1

## 2016-09-15 MED ORDER — BLOOD GLUCOSE MONITOR KIT: PACK | 0 refills | Status: DC

## 2016-09-15 MED ORDER — HYDROCHLOROTHIAZIDE 12.5 MG PO TABS: 25.0000 mg | ORAL_TABLET | Freq: Every day | ORAL | 0 refills | Status: DC

## 2016-09-15 MED ORDER — LIVING WELL WITH DIABETES BOOK
Freq: Once | Status: AC
  Administered 2016-09-15: 15:00:00
  Filled 2016-09-15 (×2): qty 1

## 2016-09-15 MED ORDER — LISINOPRIL 5 MG PO TABS: 5.0000 mg | ORAL_TABLET | Freq: Every day | ORAL | 11 refills | Status: DC

## 2016-09-15 MED ORDER — HYDROCHLOROTHIAZIDE 25 MG PO TABS
ORAL_TABLET | ORAL | Status: AC
  Filled 2016-09-15: qty 1

## 2016-09-15 MED ORDER — CARVEDILOL 25 MG PO TABS: 25.0000 mg | ORAL_TABLET | Freq: Two times a day (BID) | ORAL | 0 refills | Status: DC

## 2016-09-15 MED ORDER — INSULIN ASPART 100 UNIT/ML ~~LOC~~ SOLN
4.0000 [IU] | Freq: Three times a day (TID) | SUBCUTANEOUS | Status: DC
  Administered 2016-09-15: 4 [IU] via SUBCUTANEOUS
  Filled 2016-09-15: qty 4

## 2016-09-15 MED ORDER — POTASSIUM CHLORIDE CRYS ER 20 MEQ PO TBCR
EXTENDED_RELEASE_TABLET | ORAL | Status: AC
  Filled 2016-09-15: qty 2

## 2016-09-15 NOTE — Progress Notes (Signed)
Inpatient Diabetes Program Recommendations  AACE/ADA: New Consensus Statement on Inpatient Glycemic Control (2015)  Target Ranges:  Prepandial:   less than 140 mg/dL      Peak postprandial:   less than 180 mg/dL (1-2 hours)      Critically ill patients:  140 - 180 mg/dL   Lab Results  Component Value Date   GLUCAP 174 (H) 09/15/2016    Review of Glycemic Control  Results for [name], Lauren M (MRN [id number]) as of 09/15/2016 07:32  Ref. Range 09/14/2016 23:06 09/15/2016 00:07 09/15/2016 01:05 09/15/2016 02:06 09/15/2016 03:43  Glucose-Capillary Latest Ref Range: 65 - 99 mg/dL 409150 (H) 811119 (H) 914139 (H) 151 (H) 174 (H)    Diabetes history: Type 2 Outpatient Diabetes medications: Amaryl 2mg /day, Metformin 1000mg  qam, Actos 45 mg qday, Victoza 1.8mg  - has not been taking for months  Current orders for Inpatient glycemic control: Lantus 10 units , Novolog 0-15 units tid  Inpatient Diabetes Program Recommendations:    If patient qualifies for Medication Management clinic, consider basal insulin 20 units qday (Lantus 20 units per day or Levemir 10 units bid) and rapid acting insulin 4 units tid with meals.  If patient is going to pay for her own insulin, consider Reli-on [employer] brand 70/30 14 units bid.  Case management to see patient to see if she qualifies for medication management.   Susette RacerJulie Nicasio Barlowe, RN, BA, MHA, CDE Diabetes Coordinator Inpatient Diabetes Program  [phone] (Team Pager) [phone] [facility] Office) 09/15/2016 10:15 AM

## 2016-09-15 NOTE — Plan of Care (Signed)
Problem: Food- and Nutrition-Related Knowledge Deficit (NB-1.1) Goal: Nutrition education Formal process to instruct or train a patient/client in a skill or to impart knowledge to help patients/clients voluntarily manage or modify food choices and eating behavior to maintain or improve health. Outcome: Completed/Met Date Met: 09/15/16  RD consulted for nutrition education regarding diabetes.   Lab Results  Component Value Date   HGBA1C 13.5 (H) 09/14/2016   Spoke with patient and her family members at bedside. She reports her appetite is normally pretty good. However, due to her busy schedule she does not eat a regular meal pattern. Reports occasionally she will only eat one meal per day. She reports on other days she will often "over eat" by eating a lot of snacks and sugary drinks. Patient reports she often eats sandwiches such as lettuce, tomato, turkey or bologna. Also enjoys fruit, yogurt, ice cream. Reports she has recently cut-back on her soda to 2 per day, but learned from DM Coordinator to choose the diet alternatives.  RD provided "Carbohydrate Counting for People with Diabetes" handout from the Academy of Nutrition and Dietetics. Discussed different food groups and their effects on blood sugar, emphasizing carbohydrate-containing foods. Provided list of carbohydrates and recommended serving sizes of common foods.  RD provided "Using Nutrition Labels: Carbohydrates" handout from the Academy of Nutrition and Dietetics. Discussed how to read a nutrition label including looking at serving size and servings per container. Reviewed that there are 15 grams of carbohydrate in one carbohydrate choice. Encouraged patient to use chart for range of carbohydrate grams per choice when reading nutrition labels.  Discussed importance of controlled and consistent carbohydrate intake throughout the day. Provided examples of ways to balance meals/snacks and encouraged intake of high-fiber, whole grain  complex carbohydrates. Teach back method used.  Expect good compliance.  Body mass index is 32.96 kg/m. Pt meets criteria for obesity class I based on current BMI.  Current diet order is Carbohydrate Modified, patient is consuming approximately 100% of meals at this time. Labs and medications reviewed. No further nutrition interventions warranted at this time. RD contact information provided. If additional nutrition issues arise, please re-consult RD.  Leanne Stephens, MS, RD, LDN Pager: [phone] After Hours Pager: [phone]     

## 2016-09-15 NOTE — Progress Notes (Signed)
Pt resting comfortably throughout morning, no complaints of pain. Transferred off insulin gtt around 2am. IV NS ordered to still run at 125 mL/hr. Will continue to monitor.

## 2016-09-15 NOTE — Progress Notes (Signed)
Discharge instructions given and went over with patient and family at bedside. Living well with diabetes book provided to patient and reviewed. Prescriptions given and reviewed, discussed medication management with patient. All questions answered. Patient discharged home. Bo McclintockBrewer,Latoya Maulding S, RN

## 2016-09-15 NOTE — Care Management (Signed)
Prescriptions faxed to Medication Management. Discussed that she would need to completed the Medication Management and [facility] application prior to going over to pick up her medications, [facility] get strips [facility]. States she already has a glucometer. [facility]. Family will transport. Gwenette GreetBrenda S Jarvin Ogren RN MSN CCM Care Management [phone]

## 2016-09-15 NOTE — Discharge Summary (Signed)
[place] at [place] NAME: Lauren Mueller    MR#:  [id number]  DATE OF BIRTH:  May 15, 1955  DATE OF ADMISSION:  09/14/2016 ADMITTING PHYSICIAN: Fritzi Mandes, MD  DATE OF DISCHARGE: 09/15/2016  PRIMARY CARE PHYSICIAN: Kirk Ruths, MD    ADMISSION DIAGNOSIS:  Diabetic ketoacidosis without coma associated with type 2 diabetes mellitus ([place]) [E11.10]  DISCHARGE DIAGNOSIS:  Active Problems:   DKA, type 2 ([place])   SECONDARY DIAGNOSIS:   Past Medical History:  Diagnosis Date  . Diabetes mellitus without complication ([place])   . Hypertension     HOSPITAL COURSE:   61 year old female with a history of hypertension and type 2 diabetes who presented due to elevated blood sugar.  1. DKA with type 2 diabetes: Patient was placed on insulin drip. DKA has resolved. Patient was evaluated diabetes coordinator. Patient is referred to medication management at discharge. She will be discharged with Levemir and NovoLog. Her PCP will need to follow-up with hemoglobin A1c. She will need close follow-up regarding her diabetes.  2. Acute kidney injury in the setting of elevated blood sugars: This has improved with IV fluids.  3. Accelerated hypertension: Patient was restarted on outpatient medications and needs close follow-up for her blood pressure.  4. Hypokalemia: This was repleted    DISCHARGE CONDITIONS AND DIET:  Stable Diabetic diet  CONSULTS OBTAINED:    DRUG ALLERGIES:  No Known Allergies  DISCHARGE MEDICATIONS:   Current Discharge Medication List    START taking these medications   Details  blood glucose meter kit and supplies KIT Dispense based on patient and insurance preference. Use up to four times daily as directed. (FOR ICD-9 250.00, 250.01). Qty: 1 each, Refills: 0    insulin aspart (NOVOLOG) 100 UNIT/ML injection Inject 4 Units into the skin 3 (three) times daily with meals. Qty: 3.6 mL, Refills: 0    insulin detemir  (LEVEMIR) 100 UNIT/ML injection Inject 0.1 mLs (10 Units total) into the skin daily. Qty: 3 mL, Refills: 0    lisinopril (PRINIVIL,ZESTRIL) 5 MG tablet Take 1 tablet (5 mg total) by mouth daily. Qty: 30 tablet, Refills: 11      CONTINUE these medications which have CHANGED   Details  carvedilol (COREG) 25 MG tablet Take 1 tablet (25 mg total) by mouth 2 (two) times daily with a meal. Qty: 60 tablet, Refills: 0    hydrochlorothiazide (HYDRODIURIL) 12.5 MG tablet Take 2 tablets (25 mg total) by mouth daily. Qty: 30 tablet, Refills: 0      CONTINUE these medications which have NOT CHANGED   Details  acetaminophen (TYLENOL) 500 MG tablet Take 500 mg by mouth every 6 (six) hours as needed.      STOP taking these medications     glimepiride (AMARYL) 2 MG tablet      meloxicam (MOBIC) 15 MG tablet      metformin (FORTAMET) 1000 MG (OSM) 24 hr tablet      omeprazole (PRILOSEC) 40 MG capsule      pioglitazone (ACTOS) 45 MG tablet      gabapentin (NEURONTIN) 100 MG capsule      Liraglutide (VICTOZA) 18 MG/3ML SOPN      potassium chloride (K-DUR) 10 MEQ tablet           Today   CHIEF COMPLAINT:  Patient doing well this morning. Denies polyuria or polydipsia. Denies shortness of breath   VITAL SIGNS:  Blood pressure (!) 167/102, pulse 81, temperature 98  F (36.7 C), temperature source Oral, resp. rate 18, height '5\' 3"'  (1.6 m), weight 84.4 kg (186 lb 1.1 oz), SpO2 96 %.   REVIEW OF SYSTEMS:  Review of Systems  Constitutional: Negative.  Negative for chills, fever and malaise/fatigue.  HENT: Negative.  Negative for ear discharge, ear pain, hearing loss, nosebleeds and sore throat.   Eyes: Negative.  Negative for blurred vision and pain.  Respiratory: Negative.  Negative for cough, hemoptysis, shortness of breath and wheezing.   Cardiovascular: Negative.  Negative for chest pain, palpitations and leg swelling.  Gastrointestinal: Negative.  Negative for abdominal pain,  blood in stool, diarrhea, nausea and vomiting.  Genitourinary: Negative.  Negative for dysuria.  Musculoskeletal: Negative.  Negative for back pain.  Skin: Negative.   Neurological: Negative for dizziness, tremors, speech change, focal weakness, seizures and headaches.  Endo/Heme/Allergies: Negative.  Does not bruise/bleed easily.  Psychiatric/Behavioral: Negative.  Negative for depression, hallucinations and suicidal ideas.     PHYSICAL EXAMINATION:  GENERAL:  61 y.o.-year-old patient lying in the bed with no acute distress.  NECK:  Supple, no jugular venous distention. No thyroid enlargement, no tenderness.  LUNGS: Normal breath sounds bilaterally, no wheezing, rales,rhonchi  No use of accessory muscles of respiration.  CARDIOVASCULAR: S1, S2 normal. No murmurs, rubs, or gallops.  ABDOMEN: Soft, non-tender, non-distended. Bowel sounds present. No organomegaly or mass.  EXTREMITIES: No pedal edema, cyanosis, or clubbing.  PSYCHIATRIC: The patient is alert and oriented x 3.  SKIN: No obvious rash, lesion, or ulcer.   DATA REVIEW:   CBC  Recent Labs Lab 09/15/16 0413  WBC 8.8  HGB 13.2  HCT 40.9  PLT 329    Chemistries   Recent Labs Lab 09/15/16 0838  NA 139  K 3.3*  CL 106  CO2 26  GLUCOSE 269*  BUN 31*  CREATININE 1.00  CALCIUM 8.7*    Cardiac Enzymes No results for input(s): TROPONINI in the last 168 hours.  Microbiology Results  '@[id number]' @  RADIOLOGY:  No results found.    Current Discharge Medication List    START taking these medications   Details  blood glucose meter kit and supplies KIT Dispense based on patient and insurance preference. Use up to four times daily as directed. (FOR ICD-9 250.00, 250.01). Qty: 1 each, Refills: 0    insulin aspart (NOVOLOG) 100 UNIT/ML injection Inject 4 Units into the skin 3 (three) times daily with meals. Qty: 3.6 mL, Refills: 0    insulin detemir (LEVEMIR) 100 UNIT/ML injection Inject 0.1 mLs (10 Units  total) into the skin daily. Qty: 3 mL, Refills: 0    lisinopril (PRINIVIL,ZESTRIL) 5 MG tablet Take 1 tablet (5 mg total) by mouth daily. Qty: 30 tablet, Refills: 11      CONTINUE these medications which have CHANGED   Details  carvedilol (COREG) 25 MG tablet Take 1 tablet (25 mg total) by mouth 2 (two) times daily with a meal. Qty: 60 tablet, Refills: 0    hydrochlorothiazide (HYDRODIURIL) 12.5 MG tablet Take 2 tablets (25 mg total) by mouth daily. Qty: 30 tablet, Refills: 0      CONTINUE these medications which have NOT CHANGED   Details  acetaminophen (TYLENOL) 500 MG tablet Take 500 mg by mouth every 6 (six) hours as needed.      STOP taking these medications     glimepiride (AMARYL) 2 MG tablet      meloxicam (MOBIC) 15 MG tablet      metformin (FORTAMET) 1000 MG (  OSM) 24 hr tablet      omeprazole (PRILOSEC) 40 MG capsule      pioglitazone (ACTOS) 45 MG tablet      gabapentin (NEURONTIN) 100 MG capsule      Liraglutide (VICTOZA) 18 MG/3ML SOPN      potassium chloride (K-DUR) 10 MEQ tablet            Management plans discussed with the patient and she is in agreement. Stable for discharge home  Patient should follow up with pcp  CODE STATUS:     Code Status Orders        Start     Ordered   09/14/16 1436  Full code  Continuous     09/14/16 1435    Code Status History    Date Active Date Inactive Code Status Order ID Comments User Context   This patient has a current code status but no historical code status.      TOTAL TIME TAKING CARE OF THIS PATIENT: 37 minutes.    Note: This dictation was prepared with Dragon dictation along with smaller phrase technology. Any transcriptional errors that result from this process are unintentional.  Jaxan Michel M.D on 09/15/2016 at 10:33 AM  Between 7am to 6pm - Pager - [phone] After 6pm go to [email or web address] - password [email or web address] [facility]  Office  [phone]  CC: Primary  care physician; Kirk Ruths, MD

## 2016-09-15 NOTE — Progress Notes (Signed)
Spoke with patient and her family about diabetes and home regimen for diabetes control. Patient reports that she is followed by PCP, Dr. Gaynell FaceMarshall for diabetes management. She tells me she could afford the Actos, Metformin and the Amaryl but that the victoza was too expensive.  The MD visit is $100.00 each visit. We discussed the [facility] and the high quality care she will continue to receive.    Discussed importance of checking CBGs and maintaining good CBG control to prevent long-term and short-term complications. Explained how hyperglycemia leads to damage within blood vessels which lead to the common complications seen with uncontrolled diabetes. Stressed to the patient the importance of improving glycemic control to prevent further complications from uncontrolled diabetes.   Discussed impact of nutrition, exercise, stress, sickness, and medications on diabetes control.. Encouraged patient to check his glucose 4 times per day (before meals and at bedtime) and to keep a log book of glucose readings and insulin taken which he will need to take to doctor appointments. She has a glucometer but complains the strips are too expensive- I have encouraged her to get the [employer] Reli-on meter and testing strips and to purchase a sharps container since there is a small child in the family.  Patient is comfortable with insulin administration because she uses Victoza without difficulty.  She also tells me she has drawn insulin from a vial in the past as well.  We reviewed the use of the insulin pen and how it differs from the Victoza pen.  Explained how the doctor he follows up with can use the log book to continue to make insulin adjustments if needed.  Reviewed meal planning using the plate method- family was engaged and asked numerous questions.  Reviewed the treatment for low blood sugar and have ordered the Living Well with Diabetes book as a reference- encouraged the patient and her  family to read through the book and to ask questions of the RN before she leaves.   Patient verbalized understanding of information discussed and she states that he has no further questions at this time related to diabetes.  Thanks,  Lauren RacerJulie Laneshia Pina, RN, BA, MHA, CDE Diabetes Coordinator Inpatient Diabetes Program  [phone] (Team Pager) [phone] [facility] Office) 09/15/2016 1:45 PM

## 2016-09-16 LAB — HEMOGLOBIN A1C
HEMOGLOBIN A1C: 13.4 % — AB (ref 4.8–5.6)
Mean Plasma Glucose: 338 mg/dL

## 2016-09-19 ENCOUNTER — Telehealth: Payer: Self-pay

## 2016-09-19 NOTE — Telephone Encounter (Signed)
Aram Beechamynthia Height SW patient about [facility] eligibility [facility] - New Patient Interview  Patient Information:  Lauren Mueller [address] [place] [id number] [phone] (home)   1. Have you ever been seen at the clinic before? No    2. What do you need to be seen for?        Note: We do not see patients for Physicals, Disability/ Medicaid Determinations, Mental Health [phone]), TB/STD Testing (Health Dept [phone]), Mammograms/[facility] [phone]), Pregnancy (Health Dept [phone]), Pain of any kind (Ref to [facility] [phone] and [facility] [phone])  3.  [place] you [place]? Yes   Note: If No, they are not eligible for the clinic.  4. Do you have insurance (Medicare, Medicaid, [place], or any other form of insurance -  this includes Medicare Part A only) ? NO  Note: If Yes, they are not eligible for the clinic.  5. How do you support yourself? Are you working?  Note: Their monthly income (from a job, unemployment, Tree surgeonsocial security, disability, child support, worker's compensation, etc.) cannot exceed $1,450. If they are slightly over this amount, please see Jolinda CroakLori, Tracy, or [facility] for approval.   -$ _____ per hour X _____ hours per week =  _____ -Total: _____ X 4.5 = _____   Note: If they have no income, they are eligible for the clinic with a notarized letter from the support person. If they cannot provide a support person, they will not be eligible for the clinic.    If the person is eligible for the clinic according to the information above, please go over the documentation they will need to bring in before they will be scheduled for an appointment.

## 2016-12-20 ENCOUNTER — Telehealth: Payer: Self-pay | Admitting: Nurse Practitioner

## 2016-12-20 NOTE — Telephone Encounter (Signed)
Missed a call from [facility], call back.

## 2016-12-22 ENCOUNTER — Telehealth: Payer: Self-pay

## 2016-12-22 NOTE — Telephone Encounter (Signed)
Gave pt info on eligibility. PT verbalized understanding.

## 2016-12-27 ENCOUNTER — Telehealth: Payer: Self-pay | Admitting: Nurse Practitioner

## 2016-12-27 NOTE — Telephone Encounter (Signed)
Returning our call

## 2016-12-29 ENCOUNTER — Ambulatory Visit: Payer: Self-pay | Admitting: Adult Health Nurse Practitioner

## 2016-12-29 VITALS — BP 214/126 | HR 97 | Temp 98.4°F | Wt 195.0 lb

## 2016-12-29 DIAGNOSIS — I10 Essential (primary) hypertension: Secondary | ICD-10-CM | POA: Insufficient documentation

## 2016-12-29 DIAGNOSIS — E119 Type 2 diabetes mellitus without complications: Secondary | ICD-10-CM

## 2016-12-29 DIAGNOSIS — Z794 Long term (current) use of insulin: Principal | ICD-10-CM

## 2016-12-29 LAB — GLUCOSE, POCT (MANUAL RESULT ENTRY): POC Glucose: 445 mg/dl — AB (ref 70–99)

## 2016-12-29 MED ORDER — METFORMIN HCL 1000 MG PO TABS: 1000.0000 mg | ORAL_TABLET | Freq: Two times a day (BID) | ORAL | 1 refills | Status: DC

## 2016-12-29 MED ORDER — PIOGLITAZONE HCL 45 MG PO TABS: 45.0000 mg | ORAL_TABLET | Freq: Every day | ORAL | 1 refills | Status: DC

## 2016-12-29 MED ORDER — INSULIN DETEMIR 100 UNIT/ML ~~LOC~~ SOLN: 10.0000 [IU] | Freq: Every day | SUBCUTANEOUS | 11 refills | Status: DC

## 2016-12-29 MED ORDER — CARVEDILOL 25 MG PO TABS: 25.0000 mg | ORAL_TABLET | Freq: Two times a day (BID) | ORAL | 0 refills | Status: DC

## 2016-12-29 MED ORDER — LISINOPRIL 5 MG PO TABS: 5.0000 mg | ORAL_TABLET | Freq: Every day | ORAL | 11 refills | Status: DC

## 2016-12-29 MED ORDER — INSULIN ASPART 100 UNIT/ML ~~LOC~~ SOLN: 4.0000 [IU] | Freq: Three times a day (TID) | SUBCUTANEOUS | 11 refills | Status: DC

## 2016-12-29 MED ORDER — HYDROCHLOROTHIAZIDE 25 MG PO TABS: 25.0000 mg | ORAL_TABLET | Freq: Every day | ORAL | 4 refills | Status: DC

## 2016-12-29 NOTE — Progress Notes (Signed)
Patient: Lauren Mueller Female    DOB: 01-Jan-1956   61 y.o.   MRN: [id number] Visit Date: 12/29/2016  Today's Provider: Staci Acosta, NP   Chief Complaint  Patient presents with  . Follow-up    medications refill   Subjective:    HPI  Pt states that her Bp usually doesn't run that high but she has been out of medications for 2-3 weeks. Some dizziness no headaches.  Denies chest, denies CVA symptoms.   Pt takes novolog 4 units TID with meals.  Levemir 10 units daily.  Takes Metformin 1000 mg daily and Actos 82m daily.  Last A1c was 13.4.     Pt states that sometimes she has joint pain in the knees, hands and hip.  Pt states that meloxicam, tylenol or aleeve does not help the pain.  Pt states that she gets stiff when she sits for prolonged periods.   No Known Allergies Previous Medications   ACETAMINOPHEN (TYLENOL) 500 MG TABLET    Take 500 mg by mouth every 6 (six) hours as needed.   BLOOD GLUCOSE METER KIT AND SUPPLIES KIT    Dispense based on patient and insurance preference. Use up to four times daily as directed. (FOR ICD-9 250.00, 250.01).   CARVEDILOL (COREG) 25 MG TABLET    Take 1 tablet (25 mg total) by mouth 2 (two) times daily with a meal.   HYDROCHLOROTHIAZIDE (HYDRODIURIL) 12.5 MG TABLET    Take 2 tablets (25 mg total) by mouth daily.   INSULIN ASPART (NOVOLOG) 100 UNIT/ML INJECTION    Inject 4 Units into the skin 3 (three) times daily with meals.   INSULIN DETEMIR (LEVEMIR) 100 UNIT/ML INJECTION    Inject 0.1 mLs (10 Units total) into the skin daily.   LISINOPRIL (PRINIVIL,ZESTRIL) 5 MG TABLET    Take 1 tablet (5 mg total) by mouth daily.    Review of Systems  All other systems reviewed and are negative.   Social History  Substance Use Topics  . Smoking status: Never Smoker  . Smokeless tobacco: Not on file  . Alcohol use Not on file   Objective:   BP (!) 214/126 (BP Location: Left Arm)   Pulse 97   Temp 98.4 F (36.9 C)   Wt 195 lb (88.5 kg)    BMI 34.54 kg/m   Physical Exam  Constitutional: She is oriented to person, place, and time. She appears well-developed and well-nourished.  HENT:  Head: Normocephalic and atraumatic.  Eyes: Pupils are equal, round, and reactive to light.  Neck: Normal range of motion. Neck supple.  Cardiovascular: Normal rate, regular rhythm, normal heart sounds and intact distal pulses.   Pulmonary/Chest: Effort normal and breath sounds normal.  Abdominal: Soft. Bowel sounds are normal.  Musculoskeletal:  Good strength in the hands bilaterally.   Neurological: She is alert and oriented to person, place, and time.  Skin: Skin is warm and dry.  Vitals reviewed.       Assessment & Plan:         DM:  Not controlled.  Encourage diabetic diet and exercise.  Continue current medication regimen.   HTN:   Not controlled.  Goal BP <140/90.  Continue current medication regimen.  Encourage low salt diet and exercise.  Reviewed CVA s/s and ER precautions.  FU in 1 week for BP check.   Arthritis:  Referred to Dr. FVickki Hearing   Routine labs ordered.  Medications refilled.      TStaci Acosta NP  [facility]

## 2016-12-29 NOTE — Addendum Note (Signed)
Addended by: Linward Headland D on: 12/29/2016 07:50 PM   Modules accepted: Orders

## 2016-12-30 LAB — COMPREHENSIVE METABOLIC PANEL
A/G RATIO: 1.5 (ref 1.2–2.2)
ALK PHOS: 112 IU/L (ref 39–117)
ALT: 15 IU/L (ref 0–32)
AST: 13 IU/L (ref 0–40)
Albumin: 4.3 g/dL (ref 3.5–5.5)
BUN/Creatinine Ratio: 17 (ref 9–23)
BUN: 18 mg/dL (ref 6–24)
CALCIUM: 9.7 mg/dL (ref 8.7–10.2)
CHLORIDE: 97 mmol/L (ref 96–106)
CO2: 24 mmol/L (ref 20–29)
Creatinine, Ser: 1.04 mg/dL — ABNORMAL HIGH (ref 0.57–1.00)
GFR calc Af Amer: 68 mL/min/{1.73_m2} (ref >59)
GFR, EST NON AFRICAN AMERICAN: 59 mL/min/{1.73_m2} — AB (ref >59)
GLOBULIN, TOTAL: 2.8 g/dL (ref 1.5–4.5)
Glucose: 368 mg/dL — ABNORMAL HIGH (ref 65–99)
POTASSIUM: 3.8 mmol/L (ref 3.5–5.2)
SODIUM: 139 mmol/L (ref 134–144)
Total Protein: 7.1 g/dL (ref 6.0–8.5)

## 2016-12-30 LAB — LIPID PANEL
Chol/HDL Ratio: 4.9 ratio — ABNORMAL HIGH (ref 0.0–4.4)
Cholesterol, Total: 263 mg/dL — ABNORMAL HIGH (ref 100–199)
HDL: 54 mg/dL (ref >39)
LDL CALC: 135 mg/dL — AB (ref 0–99)
Triglycerides: 371 mg/dL — ABNORMAL HIGH (ref 0–149)
VLDL CHOLESTEROL CAL: 74 mg/dL — AB (ref 5–40)

## 2016-12-30 LAB — TSH: TSH: 0.781 u[IU]/mL (ref 0.450–4.500)

## 2016-12-30 LAB — HEMOGLOBIN A1C
ESTIMATED AVERAGE GLUCOSE: 335 mg/dL
Hgb A1c MFr Bld: 13.3 % — ABNORMAL HIGH (ref 4.8–5.6)

## 2017-01-02 ENCOUNTER — Ambulatory Visit: Payer: PRIVATE HEALTH INSURANCE | Admitting: Pharmacy Technician

## 2017-01-02 DIAGNOSIS — Z79899 Other long term (current) drug therapy: Secondary | ICD-10-CM

## 2017-01-03 ENCOUNTER — Telehealth: Payer: Self-pay | Admitting: Nurse Practitioner

## 2017-01-03 NOTE — Telephone Encounter (Signed)
Has questions about her prescriptions

## 2017-01-04 ENCOUNTER — Ambulatory Visit: Payer: Self-pay | Admitting: Specialist

## 2017-01-04 DIAGNOSIS — M25559 Pain in unspecified hip: Secondary | ICD-10-CM

## 2017-01-04 NOTE — Progress Notes (Signed)
   Subjective:    Patient ID: Lauren Mueller, female    DOB: 1956/01/04, 61 y.o.   MRN: [id number]  HPI 1 Yr hx bil hip and LBP. Bilateral radicular pain to feet. Does stairs one at a time. Would not walk a city block. Has been tried on NSAID's including Mobic.     Review of Systems See PCP notes.    Objective:   Physical Exam Gait NL. Heel/toe okay. Trendelenberg sign neg. DTR's 2+ knees and ankles. MMT 5/5. In supine position FROM both hips. Left hip at 90 degrees flexion with IR cause pain.  Imaging shows mild/mod OA hips.        Assessment & Plan:  Plan: Currently activity modification for the hips is the best we have to offer. She is not a surgical candidate. For her LBP I would refer for PT for a short course of core exercises to establish a HEP. Return after PT is completed.

## 2017-01-05 ENCOUNTER — Ambulatory Visit: Payer: Self-pay | Admitting: Adult Health Nurse Practitioner

## 2017-01-05 ENCOUNTER — Other Ambulatory Visit: Payer: Self-pay | Admitting: Adult Health Nurse Practitioner

## 2017-01-05 ENCOUNTER — Telehealth: Payer: Self-pay

## 2017-01-05 VITALS — BP 172/100 | HR 74 | Temp 98.3°F | Wt 194.8 lb

## 2017-01-05 DIAGNOSIS — E782 Mixed hyperlipidemia: Secondary | ICD-10-CM | POA: Insufficient documentation

## 2017-01-05 DIAGNOSIS — E119 Type 2 diabetes mellitus without complications: Secondary | ICD-10-CM

## 2017-01-05 DIAGNOSIS — I10 Essential (primary) hypertension: Secondary | ICD-10-CM

## 2017-01-05 DIAGNOSIS — E785 Hyperlipidemia, unspecified: Secondary | ICD-10-CM

## 2017-01-05 DIAGNOSIS — E1169 Type 2 diabetes mellitus with other specified complication: Secondary | ICD-10-CM | POA: Insufficient documentation

## 2017-01-05 LAB — GLUCOSE, POCT (MANUAL RESULT ENTRY): POC Glucose: 199 mg/dl — AB (ref 70–99)

## 2017-01-05 MED ORDER — INSULIN DETEMIR 100 UNIT/ML ~~LOC~~ SOLN: 10.0000 [IU] | Freq: Every day | SUBCUTANEOUS | 11 refills | Status: DC

## 2017-01-05 MED ORDER — CARVEDILOL 25 MG PO TABS: 25.0000 mg | ORAL_TABLET | Freq: Two times a day (BID) | ORAL | 0 refills | Status: DC

## 2017-01-05 MED ORDER — INSULIN LISPRO 100 UNIT/ML ~~LOC~~ SOLN: 4.0000 [IU] | Freq: Three times a day (TID) | SUBCUTANEOUS | 11 refills | Status: DC

## 2017-01-05 MED ORDER — HYDROCHLOROTHIAZIDE 25 MG PO TABS: 25.0000 mg | ORAL_TABLET | Freq: Every day | ORAL | 4 refills | Status: DC

## 2017-01-05 MED ORDER — INSULIN ASPART 100 UNIT/ML ~~LOC~~ SOLN: 4.0000 [IU] | Freq: Three times a day (TID) | SUBCUTANEOUS | 11 refills | Status: DC

## 2017-01-05 MED ORDER — ATORVASTATIN CALCIUM 20 MG PO TABS: 20.0000 mg | ORAL_TABLET | Freq: Every day | ORAL | 3 refills | Status: DC

## 2017-01-05 MED ORDER — LISINOPRIL 20 MG PO TABS: 20.0000 mg | ORAL_TABLET | Freq: Every day | ORAL | 11 refills | Status: DC

## 2017-01-05 NOTE — Progress Notes (Signed)
Patient: Lauren Mueller Female    DOB: 03-08-1956   61 y.o.   MRN: [id number] Visit Date: 01/05/2017  Today's Provider: Staci Acosta, NP   Chief Complaint  Patient presents with  . Diabetes    Pharmacy did not receive insulin order from last time   Subjective:    HPI   Pt states that she just received her insulin today.  A1c- 13.3.  CBG avg- 250-325 Taking Levemir 10 units daily and Novolog 4 units TID. Endorses diet modification.   Taking BP medications as directed.  Denies Ha, dizziness or CP.    LDL-135.      No Known Allergies Previous Medications   ACETAMINOPHEN (TYLENOL) 500 MG TABLET    Take 500 mg by mouth every 6 (six) hours as needed.   BLOOD GLUCOSE METER KIT AND SUPPLIES KIT    Dispense based on patient and insurance preference. Use up to four times daily as directed. (FOR ICD-9 250.00, 250.01).   CARVEDILOL (COREG) 25 MG TABLET    Take 1 tablet (25 mg total) by mouth 2 (two) times daily with a meal.   HYDROCHLOROTHIAZIDE (HYDRODIURIL) 25 MG TABLET    Take 1 tablet (25 mg total) by mouth daily.   INSULIN DETEMIR (LEVEMIR) 100 UNIT/ML INJECTION    Inject 0.1 mLs (10 Units total) into the skin daily.   INSULIN LISPRO (HUMALOG) 100 UNIT/ML INJECTION    Inject 0.04 mLs (4 Units total) into the skin 3 (three) times daily before meals.   LISINOPRIL (PRINIVIL,ZESTRIL) 5 MG TABLET    Take 1 tablet (5 mg total) by mouth daily.   METFORMIN (GLUCOPHAGE) 1000 MG TABLET    Take 1 tablet (1,000 mg total) by mouth 2 (two) times daily with a meal.   PIOGLITAZONE (ACTOS) 45 MG TABLET    Take 1 tablet (45 mg total) by mouth daily.    Review of Systems  All other systems reviewed and are negative.   Social History  Substance Use Topics  . Smoking status: Never Smoker  . Smokeless tobacco: Not on file  . Alcohol use Not on file   Objective:   BP (!) 172/100   Pulse 74   Temp 98.3 F (36.8 C)   Wt 194 lb 12.8 oz (88.4 kg)   BMI 34.51 kg/m   Physical Exam   Constitutional: She is oriented to person, place, and time. She appears well-developed and well-nourished.  HENT:  Head: Normocephalic and atraumatic.  Eyes: Pupils are equal, round, and reactive to light.  Cardiovascular: Normal rate, regular rhythm, normal heart sounds and intact distal pulses.   Pulmonary/Chest: Effort normal and breath sounds normal.  Abdominal: Soft. Bowel sounds are normal.  Neurological: She is alert and oriented to person, place, and time.  Skin: Skin is warm and dry.  Vitals reviewed.       Assessment & Plan:         DM:  Goal A1c <7.  Not controlled.  Encourage diabetic diet and exercise.  Continue current medication regimen.  Increase Levemir to 25 units nightly.  Continue current Novolog regimen.   HTN:  Controlled.  Not controlled.  Goal BP <140/90.  Continue current medication regimen and increase Lisinopril to 61m daily.  Encourage low salt diet and exercise.    HLD:  Newly diagnosed.  Start Lipitor 252mdaily.  Continue current regimen.  Encourage low cholesterol, low fat diet and exercise.   FU in 2 weeks for BP check.  Staci Acosta, NP   [facility]

## 2017-01-05 NOTE — Telephone Encounter (Signed)
-----   Message from Vicie Mutters, [facility] sent at 01/02/2017 12:24 PM EDT ----- Regarding: Please send RX's Can you please re-send the Carvedilol, HCTZ, Novolog, and Levemir prescriptions electronically. Also we now have Humalog available at n/c from [facility]. Would it be ok to switch the patient to Humalog?  Thank you! Denice Paradise, PharmD, RPh [facility]) [phone]

## 2017-01-05 NOTE — Patient Instructions (Signed)
Diabetes: Increase Levemir to 25 units nightly.  Continue current Novolog regimen.   BP: Increase Lisinopril to  daily.       Cholesterol: Start Lipitor  daily.

## 2017-01-05 NOTE — Progress Notes (Signed)
Completed [facility] application and contract.  Patient agreed to all terms of the [facility] contract.  Patient to provide 2017 taxes, letter indicating insurance coverage had ended, last 30 days of checking statements and pension statement.  Names illegible on copy of tax return provided by patient.  Provided patient with community resource material based on her particular needs.    Sherilyn Dacosta Care Manager [facility]

## 2017-01-19 ENCOUNTER — Ambulatory Visit: Payer: Self-pay | Admitting: Adult Health Nurse Practitioner

## 2017-01-19 VITALS — BP 195/89 | HR 63 | Temp 98.0°F | Ht 64.0 in | Wt 199.2 lb

## 2017-01-19 DIAGNOSIS — E1169 Type 2 diabetes mellitus with other specified complication: Secondary | ICD-10-CM

## 2017-01-19 LAB — GLUCOSE, POCT (MANUAL RESULT ENTRY): POC Glucose: 241 mg/dl — AB (ref 70–99)

## 2017-01-19 MED ORDER — INSULIN DETEMIR 100 UNIT/ML ~~LOC~~ SOLN: 25.0000 [IU] | Freq: Every day | SUBCUTANEOUS | 11 refills | Status: DC

## 2017-01-19 NOTE — Progress Notes (Signed)
Patient: Lauren Mueller Female    DOB: [birth year]   61 y.o.   MRN: [id number] Visit Date: 01/19/2017  Today's Provider: [place]   Chief Complaint  Patient presents with  . Follow-up    med check (did not recieve new dose of levemir last visit)   Subjective:    HPI: Patient is here today for follow up. States that she did not receive her Rx for Levemir from [facility].  States that she was previously injecting Levemir 10 units QHS and has been increased to 25 units QHS.  Patient states that she has been aware that her blood pressure may be running high.  States that she is aware that she needs to make a change in her diet and take better care of herself.  Denies chest pain, shortness of breath, and heart palpations. Denies nausea, vomiting, abdominal pain.  Denies dizziness, falls, and pain today.      No Known Allergies Previous Medications   ACETAMINOPHEN (TYLENOL) 500 MG TABLET    Take 500 mg by mouth every 6 (six) hours as needed.   ATORVASTATIN (LIPITOR) 20 MG TABLET    Take 1 tablet (20 mg total) by mouth daily.   BLOOD GLUCOSE METER KIT AND SUPPLIES KIT    Dispense based on patient and insurance preference. Use up to four times daily as directed. (FOR ICD-9 250.00, 250.01).   CARVEDILOL (COREG) 25 MG TABLET    Take 1 tablet (25 mg total) by mouth 2 (two) times daily with a meal.   HYDROCHLOROTHIAZIDE (HYDRODIURIL) 25 MG TABLET    Take 1 tablet (25 mg total) by mouth daily.   INSULIN DETEMIR (LEVEMIR) 100 UNIT/ML INJECTION    Inject 0.1 mLs (10 Units total) into the skin daily.   INSULIN LISPRO (HUMALOG) 100 UNIT/ML INJECTION    Inject 0.04 mLs (4 Units total) into the skin 3 (three) times daily before meals.   LISINOPRIL (PRINIVIL,ZESTRIL) 20 MG TABLET    Take 1 tablet (20 mg total) by mouth daily.   METFORMIN (GLUCOPHAGE) 1000 MG TABLET    Take 1 tablet (1,000 mg total) by mouth 2 (two) times daily with a meal.   PIOGLITAZONE (ACTOS) 45 MG TABLET    Take 1  tablet (45 mg total) by mouth daily.    Review of Systems  Constitutional: Negative.   HENT: Negative.   Eyes: Negative.   Respiratory: Negative.   Cardiovascular: Negative.   Gastrointestinal: Negative.   Skin: Negative.   Allergic/Immunologic: Negative.   Neurological: Negative.   Hematological: Negative.   Psychiatric/Behavioral: Negative.     Social History  Substance Use Topics  . Smoking status: Never Smoker  . Smokeless tobacco: Not on file  . Alcohol use Not on file   Objective:   BP (!) 195/89 (BP Location: Left Arm)   Pulse 63   Temp 98 F (36.7 C)   Ht '5\' 4"'  (1.626 m)   Wt 199 lb 3.2 oz (90.4 kg)   BMI 34.19 kg/m   Physical Exam  Constitutional: She is oriented to person, place, and time. She appears well-developed and well-nourished.  HENT:  Head: Normocephalic and atraumatic.  Right Ear: External ear normal.  Left Ear: External ear normal.  Mouth/Throat: Oropharynx is clear and moist.  Neck: Normal range of motion. Neck supple.  Cardiovascular: Normal rate, regular rhythm and normal heart sounds.   Pulmonary/Chest: Effort normal and breath sounds normal.  Musculoskeletal: Normal range of motion.  Neurological: She is alert  and oriented to person, place, and time.  Skin: Skin is warm and dry.  Psychiatric: She has a normal mood and affect. Her behavior is normal. Judgment and thought content normal.   Physical Exam  Constitutional: She is oriented to person, place, and time. She appears well-developed and well-nourished.  HENT:  Head: Normocephalic and atraumatic.  Right Ear: External ear normal.  Left Ear: External ear normal.  Mouth/Throat: Oropharynx is clear and moist.  Neck: Normal range of motion. Neck supple.  Cardiovascular: Normal rate, regular rhythm and normal heart sounds.   Respiratory: Effort normal and breath sounds normal.  Musculoskeletal: Normal range of motion.  Neurological: She is alert and oriented to person, place, and  time.  Skin: Skin is warm and dry.  Psychiatric: She has a normal mood and affect. Her behavior is normal. Judgment and thought content normal.         Assessment & Plan:           [facility]

## 2017-02-28 ENCOUNTER — Ambulatory Visit: Payer: Self-pay | Admitting: Adult Health Nurse Practitioner

## 2017-02-28 VITALS — BP 194/84 | HR 68 | Temp 98.0°F | Wt 200.2 lb

## 2017-02-28 DIAGNOSIS — I10 Essential (primary) hypertension: Secondary | ICD-10-CM

## 2017-02-28 DIAGNOSIS — E119 Type 2 diabetes mellitus without complications: Secondary | ICD-10-CM

## 2017-02-28 LAB — GLUCOSE, POCT (MANUAL RESULT ENTRY): POC GLUCOSE: 122 mg/dL — AB (ref 70–99)

## 2017-02-28 MED ORDER — AMLODIPINE BESYLATE 5 MG PO TABS: 5.0000 mg | ORAL_TABLET | Freq: Every day | ORAL | 3 refills | Status: DC

## 2017-02-28 MED ORDER — LISINOPRIL 40 MG PO TABS: 40.0000 mg | ORAL_TABLET | Freq: Every day | ORAL | 11 refills | Status: DC

## 2017-02-28 NOTE — Progress Notes (Signed)
  Patient: Lauren Mueller Female    DOB: 08/13/1955   61 y.o.   MRN: [id number] Visit Date: 02/28/2017  Today's Provider: Jacelyn Pieah Doles-Johnson, NP   No chief complaint on file.  Subjective:    HPI   Pt has not been dieting and exercising.  Taking medications as directed.   Taking Levemir 25 units nightly  Along with Humalog.   Pt states that she has some numbness and tingling in her bilateral hands. It has been going on for years. Has been on gabapentin with no results.     No Known Allergies This SmartLink is deprecated. Use AVSMEDLIST instead to display the medication list for a patient.  Review of Systems  All other systems reviewed and are negative.   Social History   Tobacco Use  . Smoking status: Never Smoker  Substance Use Topics  . Alcohol use: Not on file   Objective:   BP (!) 194/84 (BP Location: Left Arm)   Pulse 68   Temp 98 F (36.7 C)   Wt 200 lb 3.2 oz (90.8 kg)   BMI 34.36 kg/m   Physical Exam  Constitutional: She is oriented to person, place, and time. She appears well-developed.  HENT:  Head: Normocephalic and atraumatic.  Cardiovascular: Normal rate, regular rhythm and normal heart sounds.  Pulmonary/Chest: Effort normal.  Abdominal: Soft. Bowel sounds are normal.  Neurological: She is alert and oriented to person, place, and time.  Vitals reviewed.       Assessment & Plan:         HTN:  Not controlled.  Goal BP <140/90.  Increase lisinopril to 40mg  daily, start norvasc 5mg  daily.  Encourage low salt diet and exercise.   Continue current DM regimen.   FU with Dr. Justice RocherFossier for hand pain.   FU in 4 weeks for DM, Lipids and HTN routine visit.    Jacelyn Pieah Doles-Johnson, NP   [facility] [place]

## 2017-03-14 ENCOUNTER — Telehealth: Payer: Self-pay | Admitting: Pharmacy Technician

## 2017-03-14 NOTE — Telephone Encounter (Signed)
Patient still needs to provide 401K statement to [facility] before eligibility can be determined.  Have been asking patient to provide this statement since October 2018.  Sherilyn DacostaBetty J. Shields Pautz Care Manager [facility]

## 2017-03-16 ENCOUNTER — Other Ambulatory Visit: Payer: Self-pay

## 2017-03-16 DIAGNOSIS — E119 Type 2 diabetes mellitus without complications: Secondary | ICD-10-CM

## 2017-03-16 DIAGNOSIS — I10 Essential (primary) hypertension: Secondary | ICD-10-CM

## 2017-03-17 LAB — LIPID PANEL
CHOL/HDL RATIO: 3.2 ratio (ref 0.0–4.4)
Cholesterol, Total: 167 mg/dL (ref 100–199)
HDL: 53 mg/dL (ref >39)
LDL Calculated: 81 mg/dL (ref 0–99)
Triglycerides: 163 mg/dL — ABNORMAL HIGH (ref 0–149)
VLDL Cholesterol Cal: 33 mg/dL (ref 5–40)

## 2017-03-17 LAB — COMPREHENSIVE METABOLIC PANEL
A/G RATIO: 1.3 (ref 1.2–2.2)
ALBUMIN: 3.9 g/dL (ref 3.5–5.5)
ALT: 10 IU/L (ref 0–32)
AST: 13 IU/L (ref 0–40)
Alkaline Phosphatase: 85 IU/L (ref 39–117)
BUN / CREAT RATIO: 23 (ref 9–23)
BUN: 26 mg/dL — ABNORMAL HIGH (ref 6–24)
Bilirubin Total: <0.2 mg/dL (ref 0.0–1.2)
CALCIUM: 9.5 mg/dL (ref 8.7–10.2)
CO2: 28 mmol/L (ref 20–29)
Chloride: 102 mmol/L (ref 96–106)
Creatinine, Ser: 1.14 mg/dL — ABNORMAL HIGH (ref 0.57–1.00)
GFR, EST AFRICAN AMERICAN: 61 mL/min/{1.73_m2} (ref >59)
GFR, EST NON AFRICAN AMERICAN: 53 mL/min/{1.73_m2} — AB (ref >59)
GLOBULIN, TOTAL: 2.9 g/dL (ref 1.5–4.5)
Glucose: 223 mg/dL — ABNORMAL HIGH (ref 65–99)
POTASSIUM: 4.1 mmol/L (ref 3.5–5.2)
SODIUM: 142 mmol/L (ref 134–144)
TOTAL PROTEIN: 6.8 g/dL (ref 6.0–8.5)

## 2017-03-17 LAB — HEMOGLOBIN A1C
ESTIMATED AVERAGE GLUCOSE: 338 mg/dL
HEMOGLOBIN A1C: 13.4 % — AB (ref 4.8–5.6)

## 2017-03-23 ENCOUNTER — Ambulatory Visit: Payer: Self-pay | Admitting: Adult Health Nurse Practitioner

## 2017-03-23 ENCOUNTER — Encounter: Payer: Self-pay | Admitting: Licensed Clinical Social Worker

## 2017-03-23 ENCOUNTER — Encounter: Payer: Self-pay | Admitting: Pharmacist

## 2017-03-23 ENCOUNTER — Telehealth: Payer: Self-pay | Admitting: Pharmacy Technician

## 2017-03-23 ENCOUNTER — Ambulatory Visit: Payer: Self-pay | Admitting: Licensed Clinical Social Worker

## 2017-03-23 VITALS — BP 178/90 | HR 63 | Temp 97.7°F | Wt 207.1 lb

## 2017-03-23 DIAGNOSIS — F411 Generalized anxiety disorder: Secondary | ICD-10-CM

## 2017-03-23 DIAGNOSIS — Z794 Long term (current) use of insulin: Secondary | ICD-10-CM

## 2017-03-23 DIAGNOSIS — E785 Hyperlipidemia, unspecified: Secondary | ICD-10-CM

## 2017-03-23 DIAGNOSIS — F321 Major depressive disorder, single episode, moderate: Secondary | ICD-10-CM | POA: Insufficient documentation

## 2017-03-23 DIAGNOSIS — E119 Type 2 diabetes mellitus without complications: Secondary | ICD-10-CM

## 2017-03-23 DIAGNOSIS — E1169 Type 2 diabetes mellitus with other specified complication: Secondary | ICD-10-CM

## 2017-03-23 DIAGNOSIS — I10 Essential (primary) hypertension: Secondary | ICD-10-CM

## 2017-03-23 DIAGNOSIS — F331 Major depressive disorder, recurrent, moderate: Secondary | ICD-10-CM

## 2017-03-23 MED ORDER — PIOGLITAZONE HCL 45 MG PO TABS: 45.0000 mg | ORAL_TABLET | Freq: Every day | ORAL | 1 refills | Status: DC

## 2017-03-23 MED ORDER — AMLODIPINE BESYLATE 10 MG PO TABS: 10.0000 mg | ORAL_TABLET | Freq: Every day | ORAL | 4 refills | Status: DC

## 2017-03-23 MED ORDER — INSULIN DETEMIR 100 UNIT/ML ~~LOC~~ SOLN: 30.0000 [IU] | Freq: Every day | SUBCUTANEOUS | 11 refills | Status: DC

## 2017-03-23 MED ORDER — INSULIN LISPRO 100 UNIT/ML ~~LOC~~ SOLN: 6.0000 [IU] | Freq: Three times a day (TID) | SUBCUTANEOUS | 11 refills | Status: DC

## 2017-03-23 NOTE — Progress Notes (Signed)
Clinician saw patient at triage ; introduced herself and administered social determinants tool. After screening, clinician determined that patient could use some resources and assistance with financial constraints.   Clinician administered PHQ 9 and GAD 7 to the patient. Scores indicate for mental health integrated treatment within clinic.   Follow up with in two weeks on January 3rd with [name] LCSW at [facility].

## 2017-03-23 NOTE — Progress Notes (Signed)
  Patient: Lauren Mueller Female    DOB: 07/11/1955   61 y.o.   MRN: [id number] Visit Date: 03/23/2017  Today's Provider: Jacelyn Pieah Doles-Johnson, NP   No chief complaint on file.  Subjective:    HPI   Here for HTN FU- last visit BP was 194/84.  Lisinopril was increased to 40mg  daily and Norvasc 5mg  daily was started.   LDL-81 on recent labs.   A1C 13.4- essentially unchanged from previous.  Taking Levemir 25 units nightly and Humalog 4 units TID.  Pt states that she only takes the Humalog BID because she only eats BID.  Pt states that her CBGs average 200s. No 300s or 400s.   No Known Allergies This SmartLink is deprecated. Use AVSMEDLIST instead to display the medication list for a patient.  Review of Systems  All other systems reviewed and are negative.   Social History   Tobacco Use  . Smoking status: Never Smoker  . Smokeless tobacco: Never Used  Substance Use Topics  . Alcohol use: No    Alcohol/week: 0.0 oz    Frequency: Never   Objective:   BP (!) 178/90 (BP Location: Left Arm, Patient Position: Sitting)   Pulse 63   Temp 97.7 F (36.5 C) (Oral)   Wt 207 lb 1.6 oz (93.9 kg)   BMI 35.55 kg/m   Physical Exam      Assessment & Plan:     DM:  Not controlled.  Encourage diabetic diet and exercise.  Increase levemir to 30 units nightly.  Humalog increased to 6 units with meals- only take with meals.  If not eating dont take Humalog.   HLD:  Controlled.  Not Controlled.  Continue current regimen.  Encourage low cholesterol, low fat diet and exercise.   HTN:  Not controlled. Improved from previous.  Goal BP <140/90.  Increase Norvasc to 10mg  daily. Continue all other medications.  Encourage low salt diet and exercise.    FU in 4 weeks for BP changes.         Jacelyn Pieah Doles-Johnson, NP   [facility] [place]

## 2017-03-23 NOTE — Telephone Encounter (Signed)
Patient eligible to receive medication assistance at [facility] through 2018, as long as eligibility requirements continue to be met.  Kesler Wickham J. Chika Cichowski Care Manager [facility] 

## 2017-03-23 NOTE — Patient Instructions (Signed)
Follow up in two weeks for therapy with [name] LCSW at [facility].

## 2017-04-06 ENCOUNTER — Ambulatory Visit: Payer: Self-pay | Admitting: Licensed Clinical Social Worker

## 2017-04-06 ENCOUNTER — Other Ambulatory Visit: Payer: Self-pay

## 2017-04-06 DIAGNOSIS — F411 Generalized anxiety disorder: Secondary | ICD-10-CM

## 2017-04-06 DIAGNOSIS — F331 Major depressive disorder, recurrent, moderate: Secondary | ICD-10-CM

## 2017-04-06 DIAGNOSIS — E119 Type 2 diabetes mellitus without complications: Secondary | ICD-10-CM

## 2017-04-06 MED ORDER — INSULIN DETEMIR 100 UNIT/ML ~~LOC~~ SOLN: 30.0000 [IU] | Freq: Every day | SUBCUTANEOUS | 11 refills | Status: DC

## 2017-04-06 NOTE — Progress Notes (Signed)
  Total time:1 hour Type of Service: Integrated Behavioral Health in clinic Interpretor:No.   SUBJECTIVE: Lauren Mueller is a 62 y.o. female  referred by provider at [facility] for symptoms of:  anxiety. Patient is accompanied by her husband.  Patient reports the following symptoms and or concerns: fatigue: stressors: family issues, financial concern and illness or family illness,Health problems. Duration of problem:  Two years Impact on function: Excessive worrying about finances and health. Current or Hx of substance use: Denies a history of substance abuse. PSYCHIATRIC HISTORY - Medical conditions that might explain or contribute to symptoms: Hypertension, Type II Diabetes Mellitus without complication, and chronic pain. - Hospitalizations/ Outpatient therapy:  She has not previously been hospitalized for mental illness or substance abuse. She has not previously been in mental health therapy. -Pharmacotherapy: She has not taken psycho tropic medications in the past. -Family history of psychiatric issues: She reports that she has a niece on her husband's side of the family that has a history of mental illness and was recently just hospitalized.   OBJECTIVE:  Appearance:Casual; Mood: Euthymic ; Thought process: Coherent; Affect: Appropriate  Risk of harm to self or others: No plan to harm self or others   LIFE CONTEXT:  Family & Social:,patient lives with husband and four children.  She is involved in a women's support group and attends church on Sundays.  School/ Work: She is unemployed and has applied for disability. 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  Life changes: She feels guilty over the fact that she cannot contribute to the household financially.  GOALS ADDRESSED:  Patient will reduce symptoms of: anxiety; increase ability WU:JWJXBJof:stress reduction, will also :Increase  healthy adjustment to current life circumstances.  INTERVENTIONS:Brief Counseling/Psychotherapy, Motivational Interviewing, Mindfulness or Relaxation Training , Reflective listening Standardized Assessments completed: PHQ 9= administered on 03/23/17,indication of: moderate depression. GAD-7= administered on 03/23/17 indication of: moderate anxiety.   ISSUES DISCUSSED: Integrated care services, support system, previous and current coping skills, community resources , community support, things patient enjoy or use to enjoy doing, feeling the weight of being the problem solver in the family and trying to be the one that holds the family together.     ASSESSMENT:  Patient currently experiencing symptoms of  anxiety.  Symptoms exacerbated by husband's upcoming knee surgery, responsible for four adult children, and has applied for disability.  Patient may benefit from, and is in agreement to receive further assessment and brief therapeutic interventions to assist with managing symptoms of anxiety and depression.   PLAN: . Patient will F/U with [name], LCSW. . LCSW will F/U with phone call n/a/ . Behavioral recommendations: Follow up at [facility]. Marland Kitchen. Referral:Integrated Behavioral Health Services (In Clinic), at least once a week. . From scale of 1-10, how likely are you to follow plan: 10  Warm Hand Off Completed.

## 2017-04-12 ENCOUNTER — Ambulatory Visit: Payer: Self-pay | Admitting: Licensed Clinical Social Worker

## 2017-04-12 ENCOUNTER — Telehealth: Payer: Self-pay | Admitting: Licensed Clinical Social Worker

## 2017-04-12 NOTE — Telephone Encounter (Signed)
Clinician attempted to contact the patient due to having an appointment this morning with her on January 9th at 9 am. There was not an option to leave voicemail.

## 2017-04-14 ENCOUNTER — Telehealth: Payer: Self-pay | Admitting: Pharmacist

## 2017-04-14 NOTE — Telephone Encounter (Signed)
04/14/17 Faxed [employer] application for Humalog Vials Inject 6 units under the skin three times a day before meals-Max daily dose 18 units. Also sent letter explaining household income.Forde RadonAJ

## 2017-04-20 ENCOUNTER — Ambulatory Visit: Payer: Self-pay | Admitting: Urology

## 2017-04-20 VITALS — BP 150/86 | HR 65 | Temp 97.8°F | Wt 212.7 lb

## 2017-04-20 DIAGNOSIS — I10 Essential (primary) hypertension: Secondary | ICD-10-CM

## 2017-04-20 NOTE — Progress Notes (Signed)
Subjective:    Patient ID: Lauren Mueller, female    DOB: April 18, 1955, 62 y.o.   MRN: [id number]  HPI   Lauren Mueller is a 62 yo female here for f/u and for sinus pressure.  Pt reports sinus pressure and headaches since last night. She denies fevers or running nose. She is not taking anything for it.    Patient Active Problem List   Diagnosis Date Noted  . Depression, major, single episode, moderate ([place]) 03/23/2017  . Diabetes mellitus without complication ([place]) 15/37/9432  . Hyperlipidemia associated with type 2 diabetes mellitus ([place]) 01/05/2017  . Hypertension 12/29/2016  . DKA, type 2 ([place]) 09/14/2016  . Cervical radiculopathy at C5 10/30/2014  . Right arm weakness 10/21/2014  . Right rotator cuff tear 10/21/2014  . Type 2 diabetes mellitus with other specified complication ([place]) 76/14/7092  . Adult BMI 30+ 01/09/2014  . Supraventricular tachycardia ([place]) 01/09/2014  . Diabetes mellitus, type 2 ([place]) 01/09/2014  . Type 2 diabetes mellitus ([place]) 01/09/2014   Allergies as of 04/20/2017   No Known Allergies     Medication List        Accurate as of 04/20/17  7:09 PM. Always use your most recent med list.          acetaminophen 500 MG tablet Commonly known as:  TYLENOL Take 500 mg by mouth every 6 (six) hours as needed.   amLODipine 10 MG tablet Commonly known as:  NORVASC Take 1 tablet (10 mg total) by mouth daily.   atorvastatin 20 MG tablet Commonly known as:  LIPITOR Take 1 tablet (20 mg total) by mouth daily.   blood glucose meter kit and supplies Kit Dispense based on patient and insurance preference. Use up to four times daily as directed. (FOR ICD-9 250.00, 250.01).   carvedilol 25 MG tablet Commonly known as:  COREG Take 1 tablet (25 mg total) by mouth 2 (two) times daily with a meal.   hydrochlorothiazide 25 MG tablet Commonly known as:  HYDRODIURIL Take 1 tablet (25 mg total) by mouth daily.   insulin detemir 100 UNIT/ML injection Commonly known  as:  LEVEMIR Inject 0.3 mLs (30 Units total) into the skin at bedtime.   insulin lispro 100 UNIT/ML injection Commonly known as:  HUMALOG Inject 0.06 mLs (6 Units total) into the skin 3 (three) times daily before meals.   lisinopril 40 MG tablet Commonly known as:  PRINIVIL,ZESTRIL Take 1 tablet (40 mg total) by mouth daily.   metFORMIN 1000 MG tablet Commonly known as:  GLUCOPHAGE Take 1 tablet (1,000 mg total) by mouth 2 (two) times daily with a meal.   pioglitazone 45 MG tablet Commonly known as:  ACTOS Take 1 tablet (45 mg total) by mouth daily.        Review of Systems  Hypertension - BP is elevated today at 150/86. Pt endorses taking Norvasc 66m, Lisinopril 452m HCTZ 2581mand Coreg 71m83m RX.  Diabetes - pt self-reported blood sugar range from 111-171. She checks 1/day mid-day. Pt is taking Levemir 30 units and Humalog 6 units.    Objective:   Physical Exam  BP (!) 150/86 (BP Location: Left Arm, Patient Position: Sitting, Cuff Size: Normal)   Pulse 65   Temp 97.8 F (36.6 C)   Wt 212 lb 11.2 oz (96.5 kg)   BMI 36.51 kg/m         Assessment & Plan:   Continue to monitor BP w/ no changes to meds. Pt is  going to make Lifestyle changes - exercise, more water, and healthy eating. Referral for eye exam.  Referral for [facility] for mammogram.  F/u in 1 mo.

## 2017-04-26 ENCOUNTER — Telehealth: Payer: Self-pay | Admitting: Licensed Clinical Social Worker

## 2017-04-26 NOTE — Telephone Encounter (Signed)
Clinician contacted the patient about rescheduling her last appointment and seeing how she was feeling. She scheduled the patient tomorrow at 12:00pm for counseling.

## 2017-04-27 ENCOUNTER — Ambulatory Visit: Payer: Self-pay | Admitting: Licensed Clinical Social Worker

## 2017-04-27 DIAGNOSIS — F411 Generalized anxiety disorder: Secondary | ICD-10-CM

## 2017-04-27 NOTE — Progress Notes (Signed)
Subjective:  Patient ID: Lauren Mueller, female   DOB: 07/13/1955, 62 y.o.   MRN: [id number]    Increase emotional regulation  [name]  presents with anxiety. Onset of symptoms was the last year with gradually improving improving. Symptoms have been occurringNot influenced by the time of the day.  Symptoms are currently rated moderate. Associated signs and symptoms include: sadness and frustrationFinancial difficulties Health problems.   [name] reports that she has been doing okay. She notes that she is happy to be out of the house and hasn't been out since her last doctor appointment due to being sick. She notes that she is feeling a lot better. She reports that she is disappointed and frustrated that she hasn't heard from disability yet. She explains that she feels guilty and worried that she is unable to contribute financially to the household. She notes that she does the laundry, cooks, cleans, errands, and taking the kids where they need to go. She explains that her plan to practice self care is to walk around the park where she lives but the weather has been a barrier. She notes that its hard to not worry about things. She denies suicidal and homicidal thoughts.   Therapeutic Interventions: Cognitive Behavioral therapy and Supportive therapy was utilized by the clinician during today's session. Clinician processed with the patient regarding how she has been doing since the last session. Clinician explained to the patient that she is glad to hear that she is feeling better. Clinician processed with the patient regarding that she understands that she is frustrated but being frustrated will not speed up the process of hearing from disability. Clinician explained that despite not being able to contribute financially that she does a lot around the house for her entire family and is basically a taxi service to them. Clinician discussed with the patient regarding that in a way she did get to have a  break from the stressors of life by ending up sick and being forced to take a break. Clinician encouraged the patient to practice self care, stick to her routine, and distract herself from over thinking things.   Return visit in 1 week.    Effectiveness:  Established problem, stable/improving (1). Progressing It is felt more time is needed for Interventions to work.  . Patient is fully  Other:  orientated to time and place. Patient's Appropriate into problems. Active. Thought process is  Coherent.Minimal: No identifiable suicidal ideation.  Patients presenting with no risk factors but with morbid ruminations; may be classified as minimal risk based on the severity of the depressive symptoms and None.   Homework: Distraction techniques and stay busy.  Plan: Follow up with [name], LCSW at [facility] in one week.

## 2017-04-27 NOTE — Patient Instructions (Signed)
Follow up in one week for therapy.

## 2017-05-04 ENCOUNTER — Ambulatory Visit: Payer: Self-pay | Admitting: Licensed Clinical Social Worker

## 2017-05-09 ENCOUNTER — Telehealth: Payer: Self-pay | Admitting: Adult Health Nurse Practitioner

## 2017-05-09 NOTE — Telephone Encounter (Signed)
Wants an appointment with Herbert SetaHeather. Heather please call and schedule

## 2017-05-16 ENCOUNTER — Ambulatory Visit: Payer: Self-pay | Admitting: Licensed Clinical Social Worker

## 2017-05-16 DIAGNOSIS — F411 Generalized anxiety disorder: Secondary | ICD-10-CM

## 2017-05-16 NOTE — Progress Notes (Signed)
Subjective:  Patient ID: Lauren Mueller, female   DOB: 06/28/1955, 62 y.o.   MRN: [id number]    Increase emotional regulation  Lauren Mueller  presents with anxiety. Onset of symptoms was in the last year with gradually improving with depression. Symptoms have been occurringNot influenced by the time of the day.  Symptoms are currently rated moderate. Associated signs and symptoms include: isolating herselfFinancial difficulties Health problems Marital or family conflict.   Lauren Mueller reports that her stress level has been high. She notes that her mother in [facility] a hospice/nursing home and is not receiving adequate care at night. She notes that her husband along with her in laws are taking shifts to make sure that the nurses at the facility are taking care of her. She notes that another stressor is that her husband's surgery has been pushed back until March 15th due to problems with his insurance and he wants his mother to come and live with them after she gets out of the facility she is in. She reports that a third stressor is that they are down to one car due to her daughter's care breaking down. She describes that her appetite has increased, feeling on edge, and worrying about things. She reports that she is praying about things and trying to stay positive. She notes that she will watch TV, finding something to do, or stare out the window. She notes that she is ready for her daughters to move out and get a smaller place with her husband that she can afford. She denies suicidal and homicidal thoughts.   Therapeutic Interventions: Cognitive Behavioral therapy was utilized by the clinician during today's session. Clinician processed with the patient regarding how she has been doing since their last session. Clinician processed with the patient regarding the stress she is experiencing. Clinician suggested that her husband along with his siblings make a complaint with the facility  about concerns with his mother's overall care. Clinician explained that if her husband is discharged from the hospital the same time as his mother is discharged from the facility she is in that it will be a lot to take care of both of them. Clinician suggested that she discuss with her husband about the reasons that his mom was in the hospital and at the facility she is in now. Clinician explained that hopefully the issue with the care is a fixable one. Clinician asked the patient what coping skills she is utilizing. Clinician encouraged the patient to keep herself busy and distracted so she is not eating when she is bored.    Return visit in 1 week.    Effectiveness:  Established problem, stable/improving (1). Progressing It is felt more time is needed for Interventions to work.  . Patient is fullyOther:  orientated to time and place. Patient's Appropriate into problems. Active. Thought process is  Coherent.Minimal: No identifiable suicidal ideation.  Patients presenting with no risk factors but with morbid ruminations; may be classified as minimal risk based on the severity of the depressive symptoms and None.   Homework: Practice self care. Find ways to keep herself busy without snacking.  Plan: Follow up with Lauren BullocksHeather Hillman Attig, LCSW at [facility] in one week.

## 2017-05-25 ENCOUNTER — Ambulatory Visit: Payer: Self-pay | Admitting: Adult Health Nurse Practitioner

## 2017-05-25 ENCOUNTER — Ambulatory Visit: Payer: Self-pay | Admitting: Licensed Clinical Social Worker

## 2017-05-25 VITALS — BP 146/80 | HR 77 | Temp 98.0°F | Wt 216.4 lb

## 2017-05-25 DIAGNOSIS — E119 Type 2 diabetes mellitus without complications: Secondary | ICD-10-CM

## 2017-05-25 DIAGNOSIS — I10 Essential (primary) hypertension: Secondary | ICD-10-CM

## 2017-05-25 DIAGNOSIS — F411 Generalized anxiety disorder: Secondary | ICD-10-CM

## 2017-05-25 MED ORDER — INSULIN DETEMIR 100 UNIT/ML ~~LOC~~ SOLN: 30.0000 [IU] | Freq: Every day | SUBCUTANEOUS | 11 refills | Status: DC

## 2017-05-25 MED ORDER — INSULIN LISPRO 100 UNIT/ML ~~LOC~~ SOLN: 6.0000 [IU] | Freq: Three times a day (TID) | SUBCUTANEOUS | 11 refills | Status: DC

## 2017-05-25 MED ORDER — CARVEDILOL 25 MG PO TABS: 25.0000 mg | ORAL_TABLET | Freq: Two times a day (BID) | ORAL | 3 refills | Status: DC

## 2017-05-25 NOTE — Progress Notes (Signed)
Subjective:    Patient ID: Lauren Mueller, female    DOB: April 04, 1956, 62 y.o.   MRN: [id number]  HPI   Lauren Mueller is a 62 yo female here for f/u of hypertension. BP today is 146/80 which is down from last visit on 04/20/17 of 150/86. No meds were changed and Lifestyle change encouraged.   Pt needs info for eye exam and mammogram.   Patient Active Problem List   Diagnosis Date Noted  . Depression, major, single episode, moderate ([place]) 03/23/2017  . Diabetes mellitus without complication ([place]) 15/72/6203  . Hyperlipidemia associated with type 2 diabetes mellitus ([place]) 01/05/2017  . Hypertension 12/29/2016  . DKA, type 2 ([place]) 09/14/2016  . Cervical radiculopathy at C5 10/30/2014  . Right arm weakness 10/21/2014  . Right rotator cuff tear 10/21/2014  . Type 2 diabetes mellitus with other specified complication ([place]) 55/97/4163  . Adult BMI 30+ 01/09/2014  . Supraventricular tachycardia ([place]) 01/09/2014  . Diabetes mellitus, type 2 ([place]) 01/09/2014  . Type 2 diabetes mellitus ([place]) 01/09/2014   Allergies as of 05/25/2017   No Known Allergies     Medication List        Accurate as of 05/25/17  6:23 PM. Always use your most recent med list.          acetaminophen 500 MG tablet Commonly known as:  TYLENOL Take 500 mg by mouth every 6 (six) hours as needed.   amLODipine 10 MG tablet Commonly known as:  NORVASC Take 1 tablet (10 mg total) by mouth daily.   atorvastatin 20 MG tablet Commonly known as:  LIPITOR Take 1 tablet (20 mg total) by mouth daily.   blood glucose meter kit and supplies Kit Dispense based on patient and insurance preference. Use up to four times daily as directed. (FOR ICD-9 250.00, 250.01).   carvedilol 25 MG tablet Commonly known as:  COREG Take 1 tablet (25 mg total) by mouth 2 (two) times daily with a meal.   hydrochlorothiazide 25 MG tablet Commonly known as:  HYDRODIURIL Take 1 tablet (25 mg total) by mouth daily.   insulin detemir 100  UNIT/ML injection Commonly known as:  LEVEMIR Inject 0.3 mLs (30 Units total) into the skin at bedtime.   insulin lispro 100 UNIT/ML injection Commonly known as:  HUMALOG Inject 0.06 mLs (6 Units total) into the skin 3 (three) times daily before meals.   lisinopril 40 MG tablet Commonly known as:  PRINIVIL,ZESTRIL Take 1 tablet (40 mg total) by mouth daily.   metFORMIN 1000 MG tablet Commonly known as:  GLUCOPHAGE Take 1 tablet (1,000 mg total) by mouth 2 (two) times daily with a meal.   pioglitazone 45 MG tablet Commonly known as:  ACTOS Take 1 tablet (45 mg total) by mouth daily.        Review of Systems  All other systems reviewed and are negative.      Objective:   Physical Exam  Constitutional: She is oriented to person, place, and time. She appears well-developed and well-nourished.  Cardiovascular: Normal rate, regular rhythm and normal heart sounds.  Pulmonary/Chest: Effort normal and breath sounds normal.  Abdominal: Soft. Bowel sounds are normal.  Neurological: She is alert and oriented to person, place, and time.  Vitals reviewed.   BP (!) 146/80 (BP Location: Left Arm, Patient Position: Sitting, Cuff Size: Large)   Pulse 77   Temp 98 F (36.7 C)   Wt 216 lb 6.4 oz (98.2 kg)   BMI 37.14  kg/m        Assessment & Plan:   Hypertension: making no changes to meds and continue to monitor. Pt will continue Lifestyle change.  Keep referral for mammogram with [facility] and eye exam. Meds refilled.   F/u in 1 mo w/ labs 1 week before.

## 2017-05-25 NOTE — Progress Notes (Signed)
Subjective:  Patient ID: Lauren Mueller, female   DOB: 05/07/1955, 62 y.o.   MRN: [id number]    Increase skills for wellness and recovery  [name] presents with anxiety. Onset of symptoms was the last few years with gradually improving improving. Symptoms have been occurringNot influenced by the time of the day.  Symptoms are currently rated marked. Associated signs and symptoms include: stress and feeling overwhelmedFinancial difficulties Health problems Marital or family conflict.   She reports that her mother in law passed away suddenly on Tuesday and her stress level has been up. She explains that family members are reaching out to her and she is being the go between in the family. She notes that she has helped with a lot of the planning and her phone is constantly ringing. She explains that she has not been sleeping well or eating well the past few days. She notes that her husband's family is drama. She denies suicidal and homicidal thoughts.  Therapeutic Interventions: Cognitive Behavioral therapy and Supportive therapy was utilized by the clinician during today's session. Clinician processed with the patient regarding how she has been doing since the last session. Clinician expressed her loss for her mother in law's passing. Clinician processed with the patient regarding that it sounds like her plate is very heavy and it will get heavier once her husband has his surgery. Clinician suggested that the patient pass on the phone duties for at least a day to her husband so she can have a break. Clinician explained that it's important that she practice self care, eat like she is supposed to, and get adequate rest. Clinician explained that she can still be supportive to her husband and the family without having to fix every problem that arises between now and the upcoming funeral.   Return visit in 1 week.    Effectiveness:  Established problem, stable/improving (1). Progressing It is felt more  time is needed for Interventions to work.  . Patient is fullyOther:  orientated to time and place. Patient's Appropriate into problems. Active. Thought process is  Coherent.Minimal: No identifiable suicidal ideation.  Patients presenting with no risk factors but with morbid ruminations; may be classified as minimal risk based on the severity of the depressive symptoms and None.   Homework: Practice self care  Plan: Follow up with [name], LCSW at [facility] in two weeks.

## 2017-05-30 ENCOUNTER — Ambulatory Visit: Payer: Self-pay | Admitting: Licensed Clinical Social Worker

## 2017-05-30 DIAGNOSIS — F331 Major depressive disorder, recurrent, moderate: Secondary | ICD-10-CM

## 2017-05-30 DIAGNOSIS — F411 Generalized anxiety disorder: Secondary | ICD-10-CM

## 2017-05-30 NOTE — Progress Notes (Signed)
Subjective:  Patient ID: Lauren LarkGrace M Mueller, female   DOB: 11/01/1955, 62 y.o.   MRN: [id number]    Increase emotional regulation  Lauren RuedGrace Mueller presents with depression. Onset of symptoms was last few years with gradually improving improving. Symptoms have been occurringNot influenced by the time of the day.  Symptoms are currently rated moderate. Associated signs and symptoms include: sadnessFinancial difficulties Health problems.   Mrs. Payer reports that she has been doing okay since the last session. She explains that they have everything except the obituary ready for her mother in law's funeral on Friday. She notes that she is trying to stay out of the family drama because the siblings appear to be arguing about arrangements and the service. She notes that she is keeping by the phone, sitting, and watching TV. She notes that she feels down due to their financial situation and knows things will be really tight once her husband has his surgery on March 15th. She reports that she is trying to stay positive and not let things get to her. She denies suicidal and homicidal thoughts.   Therapeutic Interventions: Cognitive Behavioral therapy was utilized by the clinician during today's session. Clinician processed with the patient regarding how she has been doing since their last session. Clinician processed with the patient regarding how things are coming along with her mother in law's funeral. Clinician explained that unfortunately it seems that not everyone can fully agree in terms of arrangements and question the way things are done when someone passes in just about every family. Clinician processed with the patient regarding how she has been occupying her time and did she delegate some of the phone duties to her husband so she can have a break. Clinician processed with the patient regarding that at least the funeral is in a few days and after that she can hopefully be able to relax before her husband's  surgery. Clinician encouraged the patient to enjoy the beautiful weather while we still have it and figure out ways to relax up until Friday.   Return visit in 1 week.    Effectiveness:  Established problem, stable/improving (1). Progressing It is felt more time is needed for Interventions to work.  . Patient is fully orientated to time and place. Patient's Appropriate into problems. Active. Thought process is  Coherent.Minimal: No identifiable suicidal ideation.  Patients presenting with no risk factors but with morbid ruminations; may be classified as minimal risk based on the severity of the depressive symptoms and None.   Homework: Practice self care.  Plan: Follow up with [name], LCSW at [facility] in one week.

## 2017-05-31 ENCOUNTER — Other Ambulatory Visit: Payer: Self-pay | Admitting: Internal Medicine

## 2017-05-31 ENCOUNTER — Other Ambulatory Visit: Payer: Self-pay

## 2017-05-31 ENCOUNTER — Ambulatory Visit: Payer: PRIVATE HEALTH INSURANCE | Admitting: Pharmacist

## 2017-05-31 VITALS — BP 152/80 | Wt 218.0 lb

## 2017-05-31 DIAGNOSIS — Z79899 Other long term (current) drug therapy: Secondary | ICD-10-CM

## 2017-05-31 NOTE — Patient Instructions (Signed)
Take carvedilol and metformin TWICE a day. (morning and evening)

## 2017-06-01 NOTE — Progress Notes (Signed)
  [facility] Visit Note  Patient: Lauren Mueller Lauren Mueller MRN: [id number] Date of Birth: 03/08/1956 PCP: Lauro RegulusAnderson, Marshall W, MD   Lauren Mueller 62 y.o. female presents for a follow up medication therapy management visit at [facility] today with pharmacist.   BP (!) 152/80   Wt 218 lb (98.9 kg)   BMI 37.42 kg/Mueller   Patient Information   Past Medical History:  Diagnosis Date  . Anxiety   . Arthritis   . Depression   . Diabetes mellitus without complication (HCC)   . Hypertension       Past Surgical History:  Procedure Laterality Date  . ABDOMINAL HYSTERECTOMY    . CESAREAN SECTION     x3  . HERNIA REPAIR       Family History  Problem Relation Age of Onset  . Hypertension Mother   . Hypertension Sister    Family Support: Good  Diet: Breakfast:eggs, toast, coffee+creamer  Lunch:salad from [employer] or [employer]  Dinner:Left over salad, baked or BBQ chicken, rice, veggies   Exercise: keeps up with house/clean house on daily basis, walks to mailbox and back every day (a couple blocks away from home), patient daughter wants to start taking her to gym with her   Social History   Substance and Sexual Activity  Alcohol Use No  . Alcohol/week: 0.0 oz  . Frequency: Never      Social History   Tobacco Use  Smoking Status Never Smoker  Smokeless Tobacco Never Used      Health Maintenance  Topic Date Due  . Hepatitis C Screening  07[birth year]  . PNEUMOCOCCAL POLYSACCHARIDE VACCINE (1) 04/10/1959  . FOOT EXAM  04/10/1967  . OPHTHALMOLOGY EXAM  04/10/1967  . HIV Screening  04/09/1972  . PAP SMEAR  04/09/1978  . MAMMOGRAM  04/10/2007  . COLONOSCOPY  04/10/2007  . INFLUENZA VACCINE  11/02/2016  . HEMOGLOBIN A1C  09/14/2017  . TETANUS/TDAP  01/04/2025   Appointments:  - Eye Exam: 06/08/17 - Lab: 06/15/17 - [facility]: 06/22/17  Assessment and Plan:  1. Compliance: Ok; uses pill box. Says she forgets to take medications at  scheduled time ~2x/week but remembers to take them later in the same day. Was taking all medications at one time, regardless if twice daily medications. Counseled patient on importance of taking twice daily medications separately, one in morning-one in evening. Provided patient with another pill box to hold evenings medications.   2. Diabetes Type II: Uncontrolled, most recent A1c 13.4% (03/16/17). Patient states she has been making healthy diet changes and would like to start going to the gym with her daughter to walk on treadmill for 20-4530min. Morning BG ~130 mg/dL. Needs more needles for insulin-will provide patient with box needles #100. Will continue Levemir, Humalog, metformin and Actos.   3. Hypertension: Uncontrolled, >140/80 mmHg. Today in office BP 152/80 mmHg. Patient says she has not taken her medications today. Continue amlodipine, carvedilol, HCTZ and lisinopril.   4. Hyperlipidemia: Most recent lipid panel (03/16/17): TC 167, TG 163 (high), HDL 53, LDL 81. Continue atorvastatin 20mg .   Will schedule follow up appointment in 1 year.   Lauren Mueller, PharmD Pharmacy Resident

## 2017-06-08 ENCOUNTER — Ambulatory Visit: Payer: Self-pay | Admitting: Licensed Clinical Social Worker

## 2017-06-08 ENCOUNTER — Ambulatory Visit: Payer: Self-pay | Admitting: Ophthalmology

## 2017-06-08 DIAGNOSIS — F331 Major depressive disorder, recurrent, moderate: Secondary | ICD-10-CM

## 2017-06-08 DIAGNOSIS — F411 Generalized anxiety disorder: Secondary | ICD-10-CM

## 2017-06-08 LAB — HM DIABETES EYE EXAM

## 2017-06-08 NOTE — Progress Notes (Signed)
Subjective:  Patient ID: Lauren Mueller, female   DOB: 05/10/1955, 62 y.o.   MRN: [id number]    Increase emotional regulation  Lauren Mueller  presents with anxiety. Onset of symptoms was few years with gradually improving improving. Symptoms have been occurringNot influenced by the time of the day.  Symptoms are currently rated marked. Associated signs and symptoms include: poor social interaction and difficulty sleeping and difficulty relaxing.Financial difficulties Health problems Marital or family conflict.   She reports that things have been going okay. She reports that her mother in law's funeral went smoothly. She notes that there was no family drama. She explains that her husband is struggling with his grief and things are still fresh. She explains that her niece had an accident and is [facility] with third degree burns. She notes that her son has bone spurs in his feet and is on a heavy amount of pain medication. She notes that her husband's surgery is next Friday. She notes that a lot has been going on and her stress level is high. She explains that due to her stress and level of back pain that she is not sleeping well at all. She explains that she has a history of sleep apnea and is not using her c pap machine. She notes that she is also stressed about money and paying bills. She notes that she feels restless a good portion of the time and like she needs to get up to do things. She denies suicidal and homicidal thoughts.   Therapeutic Interventions: Cognitive Behavioral therapy was utilized by the clinician during today's session focusing on situational stressors and her anxiety. Clinician processed with the patient regarding how she has been doing since their last session. Clinician processed with the patient regarding how her mother in law's funeral went. Clinician explained that in terms of her husband's grief that it's going to take awhile for him to process her passing and figure out  how to move forward without her physically being present on this earth. Clinician processed with the patient regarding the recent family stressors. Clinician explained that it sounds like she is not only feeling stressed but it's affecting her overall ability to function and do the things that she needs to do. Clinician processed with the patient regarding that she should schedule an appointment with Dr. Justice RocherFossier due to the amount of back pain she is experiencing. Clinician explained that its important that with everything she has going on that she get adequate rest. Clinician encouraged the patient to delegate some of the tasks to her daughters after her husband's surgery to reduce her stress level.   Return visit in 1 week.    Effectiveness:  Established problem, stable/improving (1). Progressing It is felt more time is needed for Interventions to work. . Patient is fully  Other:  orientated to time and place. Patient's Appropriate into problems. Active. Thought process is  Coherent.Minimal: No identifiable suicidal ideation.  Patients presenting with no risk factors but with morbid ruminations; may be classified as minimal risk based on the severity of the depressive symptoms and None.   Homework: Practice self care and schedule an appointment with Dr. Justice RocherFossier for her back pain.  Plan: Follow up with [name], LCSW at [facility] in one week.

## 2017-06-08 NOTE — Patient Instructions (Signed)
Schedule an appointment with Dr. Justice RocherFossier for back and arthritis pain.

## 2017-06-15 ENCOUNTER — Other Ambulatory Visit: Payer: Self-pay

## 2017-06-15 ENCOUNTER — Ambulatory Visit: Payer: Self-pay | Admitting: Licensed Clinical Social Worker

## 2017-06-15 DIAGNOSIS — E119 Type 2 diabetes mellitus without complications: Secondary | ICD-10-CM

## 2017-06-15 NOTE — Progress Notes (Signed)
Subjective:  Patient ID: Lauren Mueller, female   DOB: 10/12/1955, 62 y.o.   MRN: [id number]    Increase emotional regulation  [name] presents with anxiety. Onset of symptoms was last few years with gradually improving improving. Symptoms have been occurringNot influenced by the time of the day.  Symptoms are currently rated moderate. Associated signs and symptoms include: attention difficulties, poor social interaction and sadnessHealth problems Marital or family conflict.   She reports that she has been feeling stressed and is anxious about her husband's surgery tomorrow. She notes that she is having problems being able to relax and being able to sleep. She notes that she has things organized and ready to go but she is still anxious and feeling on edge. She denies suicidal and homicidal thoughts.   Therapeutic Interventions: Cognitive Behavioral therapy was utilized during today's session focusing on patient's symptoms of anxiety. Clinician processed with the patient regarding how she has been doing since the last session. Clinician processed with the patient regarding that she thinks that things will go smoothly and reminded her that she is prepared. Clinician encouraged the patient to focus on the positives, her support system, and her family. Clinician encouraged the patient to practice self care and delegate tasks.   Return visit in 1 week.    Effectiveness:  Established problem, stable/improving (1). Progressing It is felt more time is needed for Interventions to work.  . Patient is fully or not fully Other:  orientated to time and place. Patient's Appropriate into problems. Active. Thought process is  Coherent.Minimal: No identifiable suicidal ideation.  Patients presenting with no risk factors but with morbid ruminations; may be classified as minimal risk based on the severity of the depressive symptoms and None.   Homework: Practice self care and figure out ways to relax.  Plan:  Follow up with [name], LCSW at [facility] in one week.

## 2017-06-16 LAB — COMPREHENSIVE METABOLIC PANEL
A/G RATIO: 1.3 (ref 1.2–2.2)
ALBUMIN: 4 g/dL (ref 3.6–4.8)
ALT: 15 IU/L (ref 0–32)
AST: 15 IU/L (ref 0–40)
Alkaline Phosphatase: 76 IU/L (ref 39–117)
BUN / CREAT RATIO: 23 (ref 12–28)
BUN: 25 mg/dL (ref 8–27)
CALCIUM: 9.8 mg/dL (ref 8.7–10.3)
CHLORIDE: 99 mmol/L (ref 96–106)
CO2: 26 mmol/L (ref 20–29)
Creatinine, Ser: 1.08 mg/dL — ABNORMAL HIGH (ref 0.57–1.00)
GFR calc Af Amer: 64 mL/min/{1.73_m2} (ref >59)
GFR, EST NON AFRICAN AMERICAN: 56 mL/min/{1.73_m2} — AB (ref >59)
GLOBULIN, TOTAL: 3 g/dL (ref 1.5–4.5)
Glucose: 161 mg/dL — ABNORMAL HIGH (ref 65–99)
POTASSIUM: 3.9 mmol/L (ref 3.5–5.2)
Sodium: 144 mmol/L (ref 134–144)
TOTAL PROTEIN: 7 g/dL (ref 6.0–8.5)

## 2017-06-16 LAB — HEMOGLOBIN A1C
Est. average glucose Bld gHb Est-mCnc: 229 mg/dL
Hgb A1c MFr Bld: 9.6 % — ABNORMAL HIGH (ref 4.8–5.6)

## 2017-06-22 ENCOUNTER — Encounter: Payer: Self-pay | Admitting: Adult Health

## 2017-06-22 ENCOUNTER — Ambulatory Visit: Payer: Self-pay | Admitting: Adult Health

## 2017-06-22 ENCOUNTER — Ambulatory Visit: Payer: Self-pay | Admitting: Licensed Clinical Social Worker

## 2017-06-22 VITALS — BP 128/79 | HR 65 | Temp 98.0°F | Wt 213.0 lb

## 2017-06-22 DIAGNOSIS — E119 Type 2 diabetes mellitus without complications: Secondary | ICD-10-CM

## 2017-06-22 DIAGNOSIS — F411 Generalized anxiety disorder: Secondary | ICD-10-CM

## 2017-06-22 DIAGNOSIS — M150 Primary generalized (osteo)arthritis: Principal | ICD-10-CM

## 2017-06-22 DIAGNOSIS — I471 Supraventricular tachycardia: Secondary | ICD-10-CM

## 2017-06-22 DIAGNOSIS — F321 Major depressive disorder, single episode, moderate: Secondary | ICD-10-CM

## 2017-06-22 DIAGNOSIS — E785 Hyperlipidemia, unspecified: Secondary | ICD-10-CM

## 2017-06-22 DIAGNOSIS — I152 Hypertension secondary to endocrine disorders: Secondary | ICD-10-CM

## 2017-06-22 DIAGNOSIS — Z794 Long term (current) use of insulin: Secondary | ICD-10-CM

## 2017-06-22 DIAGNOSIS — E1159 Type 2 diabetes mellitus with other circulatory complications: Secondary | ICD-10-CM

## 2017-06-22 DIAGNOSIS — E1169 Type 2 diabetes mellitus with other specified complication: Secondary | ICD-10-CM

## 2017-06-22 DIAGNOSIS — I10 Essential (primary) hypertension: Secondary | ICD-10-CM

## 2017-06-22 DIAGNOSIS — M159 Polyosteoarthritis, unspecified: Secondary | ICD-10-CM

## 2017-06-22 MED ORDER — ACETAMINOPHEN 500 MG PO TABS: 500.0000 mg | ORAL_TABLET | Freq: Once | ORAL | Status: DC

## 2017-06-22 NOTE — Progress Notes (Signed)
Patient: Lauren Mueller Female    DOB: November 29, 1955   62 y.o.   MRN: [id number] Visit Date: 06/22/2017  Today's Provider: Mary Sella, NP   Chief Complaint  Patient presents with  . Follow-up    labs  Diabetes, hypertension, hyperlipidemia and depression. Subjective:    HPI [name] presents for f/u of diabetes and for labs review. Her blood glucose levels have improved. She reports medication and diet compliance.  She reports peristent lower back and hip pain that radiates down her feet. Pain makes it hard to get up after sittinmg.Same pain in her knees as well. She is not taking any medications for now. Rates pain as a 4/10, interferes with movement "it just hurts to move".  Her blood glucose and blood pressures have been well controlled.  She reports no issues with her medications.  She denies chest pain palpitations nausea vomiting diarrhea dizziness and headache    No Known Allergies Previous Medications   ACETAMINOPHEN (TYLENOL) 500 MG TABLET    Take 500 mg by mouth every 6 (six) hours as needed.   AMLODIPINE (NORVASC) 10 MG TABLET    Take 1 tablet (10 mg total) by mouth daily.   ATORVASTATIN (LIPITOR) 20 MG TABLET    TAKE ONE TABLET BY MOUTH EVERY DAY   BLOOD GLUCOSE METER KIT AND SUPPLIES KIT    Dispense based on patient and insurance preference. Use up to four times daily as directed. (FOR ICD-9 250.00, 250.01).   CARVEDILOL (COREG) 25 MG TABLET    Take 1 tablet (25 mg total) by mouth 2 (two) times daily with a meal.   HYDROCHLOROTHIAZIDE (HYDRODIURIL) 25 MG TABLET    Take 1 tablet (25 mg total) by mouth daily.   INSULIN DETEMIR (LEVEMIR) 100 UNIT/ML INJECTION    Inject 0.3 mLs (30 Units total) into the skin at bedtime.   INSULIN LISPRO (HUMALOG) 100 UNIT/ML INJECTION    Inject 0.06 mLs (6 Units total) into the skin 3 (three) times daily before meals.   LISINOPRIL (PRINIVIL,ZESTRIL) 40 MG TABLET    Take 1 tablet (40 mg total) by mouth daily.   METFORMIN (GLUCOPHAGE) 1000 MG  TABLET    Take 1 tablet (1,000 mg total) by mouth 2 (two) times daily with a meal.   PIOGLITAZONE (ACTOS) 45 MG TABLET    Take 1 tablet (45 mg total) by mouth daily.    Review of Systems  Constitutional: Negative for chills, diaphoresis, fatigue and fever.  HENT: Negative for sneezing and sore throat.   Eyes: Negative for visual disturbance.  Respiratory: Negative for cough, shortness of breath and wheezing.   Cardiovascular: Negative for chest pain, palpitations and leg swelling.  Endocrine: Negative for polydipsia and polyphagia.  Musculoskeletal: Positive for arthralgias and back pain. Negative for joint swelling.  Skin: Negative for pallor and rash.  Neurological: Negative for dizziness, weakness, numbness and headaches.    Social History   Tobacco Use  . Smoking status: Never Smoker  . Smokeless tobacco: Never Used  Substance Use Topics  . Alcohol use: No    Alcohol/week: 0.0 oz    Frequency: Never   Objective:   BP 128/79   Pulse 65   Temp 98 F (36.7 C)   Wt 213 lb (96.6 kg)   BMI 36.56 kg/m   Physical Exam  Constitutional: She is oriented to person, place, and time. She appears well-developed and well-nourished.  HENT:  Head: Normocephalic and atraumatic.  Neck: Normal range of motion. Neck  supple.  Cardiovascular: Normal rate and regular rhythm.  Pulmonary/Chest: Effort normal and breath sounds normal.  Abdominal: Soft. Bowel sounds are normal.  Musculoskeletal: Normal range of motion.  NO CREPITATIONS on flexion and extension of the joints of the upper and lower extremities  Neurological: She is alert and oriented to person, place, and time. She has normal reflexes.  Skin: Skin is warm and dry.  Psychiatric: She has a normal mood and affect.      Assessment & Plan:  Primary osteoarthritis involving multiple joints  - Plan: acetaminophen (TYLENOL) tablet 500 mg and stretch exercises daily  Type 2 diabetes mellitus without complication, with long-term  current use of insulin (HCC) Plan: Continue current insulin dose.  Monitor for signs and symptoms of hypoglycemia  Hypertension associated with diabetes ([place]) Plan: Continue lisinopril, hydrochlorothiazide, amlodipine and Coreg  Hyperlipidemia associated with type 2 diabetes mellitus ([place]) Plan: Continue atorvastatin.  will monitor lipid panel and hepatic panel  Depression, major, single episode, moderate ([place]), Chronic Plan: Mood is stable.  She will advised to report recurrent or worse worsening symptoms.  Supraventricular tachycardia ([place]), Chronic Plan: Continue beta-blocker   Mary Sella, NP   [facility] [place]

## 2017-06-22 NOTE — Patient Instructions (Signed)
Back Exercises If you have pain in your back, do these exercises 2-3 times each day or as told by your doctor. When the pain goes away, do the exercises once each day, but repeat the steps more times for each exercise (do more repetitions). If you do not have pain in your back, do these exercises once each day or as told by your doctor. Exercises Single Knee to Chest  Do these steps 3-5 times in a row for each leg: 1. Lie on your back on a firm bed or the floor with your legs stretched out. 2. Bring one knee to your chest. 3. Hold your knee to your chest by grabbing your knee or thigh. 4. Pull on your knee until you feel a gentle stretch in your lower back. 5. Keep doing the stretch for 10-30 seconds. 6. Slowly let go of your leg and straighten it.  Pelvic Tilt  Do these steps 5-10 times in a row: 1. Lie on your back on a firm bed or the floor with your legs stretched out. 2. Bend your knees so they point up to the ceiling. Your feet should be flat on the floor. 3. Tighten your lower belly (abdomen) muscles to press your lower back against the floor. This will make your tailbone point up to the ceiling instead of pointing down to your feet or the floor. 4. Stay in this position for 5-10 seconds while you gently tighten your muscles and breathe evenly.  Cat-Cow  Do these steps until your lower back bends more easily: 1. Get on your hands and knees on a firm surface. Keep your hands under your shoulders, and keep your knees under your hips. You may put padding under your knees. 2. Let your head hang down, and make your tailbone point down to the floor so your lower back is round like the back of a cat. 3. Stay in this position for 5 seconds. 4. Slowly lift your head and make your tailbone point up to the ceiling so your back hangs low (sags) like the back of a cow. 5. Stay in this position for 5 seconds.  Press-Ups  Do these steps 5-10 times in a row: 1. Lie on your belly (face-down)  on the floor. 2. Place your hands near your head, about shoulder-width apart. 3. While you keep your back relaxed and keep your hips on the floor, slowly straighten your arms to raise the top half of your body and lift your shoulders. Do not use your back muscles. To make yourself more comfortable, you may change where you place your hands. 4. Stay in this position for 5 seconds. 5. Slowly return to lying flat on the floor.  Bridges  Do these steps 10 times in a row: 1. Lie on your back on a firm surface. 2. Bend your knees so they point up to the ceiling. Your feet should be flat on the floor. 3. Tighten your butt muscles and lift your butt off of the floor until your waist is almost as high as your knees. If you do not feel the muscles working in your butt and the back of your thighs, slide your feet 1-2 inches farther away from your butt. 4. Stay in this position for 3-5 seconds. 5. Slowly lower your butt to the floor, and let your butt muscles relax.  If this exercise is too easy, try doing it with your arms crossed over your chest. Belly Crunches  Do these steps 5-10 times in   a row: 1. Lie on your back on a firm bed or the floor with your legs stretched out. 2. Bend your knees so they point up to the ceiling. Your feet should be flat on the floor. 3. Cross your arms over your chest. 4. Tip your chin a little bit toward your chest but do not bend your neck. 5. Tighten your belly muscles and slowly raise your chest just enough to lift your shoulder blades a tiny bit off of the floor. 6. Slowly lower your chest and your head to the floor.  Back Lifts Do these steps 5-10 times in a row: 1. Lie on your belly (face-down) with your arms at your sides, and rest your forehead on the floor. 2. Tighten the muscles in your legs and your butt. 3. Slowly lift your chest off of the floor while you keep your hips on the floor. Keep the back of your head in line with the curve in your back. Look at  the floor while you do this. 4. Stay in this position for 3-5 seconds. 5. Slowly lower your chest and your face to the floor.  Contact a doctor if:  Your back pain gets a lot worse when you do an exercise.  Your back pain does not lessen 2 hours after you exercise. If you have any of these problems, stop doing the exercises. Do not do them again unless your doctor says it is okay. Get help right away if:  You have sudden, very bad back pain. If this happens, stop doing the exercises. Do not do them again unless your doctor says it is okay. This information is not intended to replace advice given to you by your health care provider. Make sure you discuss any questions you have with your health care provider. Document Released: 04/23/2010 Document Revised: 08/27/2015 Document Reviewed: 05/15/2014 Elsevier Interactive Patient Education  2018 [employer] Inc[employer] Arthritis Arthritis means joint pain. It can also mean joint disease. A joint is a place where bones come together. People who have arthritis may have:  Red joints.  Swollen joints.  Stiff joints.  Warm joints.  A fever.  A feeling of being sick.  Follow these instructions at home: Pay attention to any changes in your symptoms. Take these actions to help with your pain and swelling. Medicines  Take over-the-counter and prescription medicines only as told by your doctor.  Do not take aspirin for pain if your doctor says that you may have gout. Activity  Rest your joint if your doctor tells you to.  Avoid activities that make the pain worse.  Exercise your joint regularly as told by your doctor. Try doing exercises like: ? Swimming. ? Water aerobics. ? Biking. ? Walking. Joint Care   If your joint is swollen, keep it raised (elevated) if told by your doctor.  If your joint feels stiff in the morning, try taking a warm shower.  If you have diabetes, do not apply heat without asking your doctor.  If told, apply  heat to the joint: ? Put a towel between the joint and the hot pack or heating pad. ? Leave the heat on the area for 20-30 minutes.  If told, apply ice to the joint: ? Put ice in a plastic bag. ? Place a towel between your skin and the bag. ? Leave the ice on for 20 minutes, 2-3 times per day.  Keep all follow-up visits as told by your doctor. Contact a doctor if:  The pain  gets worse.  You have a fever. Get help right away if:  You have very bad pain in your joint.  You have swelling in your joint.  Your joint is red.  Many joints become painful and swollen.  You have very bad back pain.  Your leg is very weak.  You cannot control your pee (urine) or poop (stool). This information is not intended to replace advice given to you by your health care provider. Make sure you discuss any questions you have with your health care provider. Document Released: 06/15/2009 Document Revised: 08/27/2015 Document Reviewed: 06/16/2014 Elsevier Interactive Patient Education  2018 [employer]. Back Pain, Adult Back pain is very common. The pain often gets better over time. The cause of back pain is usually not dangerous. Most people can learn to manage their back pain on their own. Follow these instructions at home: Watch your back pain for any changes. The following actions may help to lessen any pain you are feeling:  Stay active. Start with short walks on flat ground if you can. Try to walk farther each day.  Exercise regularly as told by your doctor. Exercise helps your back heal faster. It also helps avoid future injury by keeping your muscles strong and flexible.  Do not sit, drive, or stand in one place for more than 30 minutes.  Do not stay in bed. Resting more than 1-2 days can slow down your recovery.  Be careful when you bend or lift an object. Use good form when lifting: ? Bend at your knees. ? Keep the object close to your body. ? Do not twist.  Sleep on a firm  mattress. Lie on your side, and bend your knees. If you lie on your back, put a pillow under your knees.  Take medicines only as told by your doctor.  Put ice on the injured area. ? Put ice in a plastic bag. ? Place a towel between your skin and the bag. ? Leave the ice on for 20 minutes, 2-3 times a day for the first 2-3 days. After that, you can switch between ice and heat packs.  Avoid feeling anxious or stressed. Find good ways to deal with stress, such as exercise.  Maintain a healthy weight. Extra weight puts stress on your back.  Contact a doctor if:  You have pain that does not go away with rest or medicine.  You have worsening pain that goes down into your legs or buttocks.  You have pain that does not get better in one week.  You have pain at night.  You lose weight.  You have a fever or chills. Get help right away if:  You cannot control when you poop (bowel movement) or pee (urinate).  Your arms or legs feel weak.  Your arms or legs lose feeling (numbness).  You feel sick to your stomach (nauseous) or throw up (vomit).  You have belly (abdominal) pain.  You feel like you may pass out (faint). This information is not intended to replace advice given to you by your health care provider. Make sure you discuss any questions you have with your health care provider. Document Released: 09/07/2007 Document Revised: 08/27/2015 Document Reviewed: 07/23/2013 Elsevier Interactive Patient Education  [employer].

## 2017-06-22 NOTE — Progress Notes (Signed)
Subjective:  Patient ID: Lauren Mueller, female   DOB: 05/15/1955, 62 y.o.   MRN: [id number]    Increase emotional regulation  [name] presents with anxiety. Onset of symptoms was several years with gradually improving improving. Symptoms have been occurringNot influenced by the time of the day.  Symptoms are currently rated moderate. Associated signs and symptoms include: anxietyFinancial difficulties Health problems.   She reports that things have been okay. She notes that her husband's surgery went well and he is at back home. She explains that she is exhausted and hasn't slept in three days. She reports that she is taking naps here and there when she can. She explains that is a lot going on and its been difficult to juggle everything. She notes that she has help with the kids and physical therapist. She explains that she can become overwhelmed easily and stressed out. She explains that her in laws try to come over and tell her what she needs to do. She explains that she is practicing self care by keeping up with her diet and her A1C dropped from 13 to 9. She denies suicidal and homicidal thoughts.   Therapeutic Interventions: Cognitive Behavioral therapy was utilized by the clinician during today's session. Clinician processed with the patient regarding how she has been doing since their last session. Clinician processed with the patient regarding how her husband's surgery went. Clinician processed with the patient regarding how she is managing taking care of her husband and all the other responsibilities. Clinician processed with the patient regarding the importance of self care and delegating tasks to her children. Clinician processed with the patient regarding to rely on her faith and power of prayer to pull her through. Clinician processed with the patient regarding that she can only control her thoughts, feelings, and actions but not anyone else's. Clinician processed with the patient  regarding that she is proud of her and she should be proud of herself for decreasing her A 1 C from 13 to 9.   Return visit in 1 week.    Effectiveness:  Established problem, stable/improving (1). Progressing It is felt more time is needed for Interventions to work.  . Patient is fully Other:  orientated to time and place. Patient's Appropriate into problems. Active. Thought process is  Coherent.Minimal: No identifiable suicidal ideation.  Patients presenting with no risk factors but with morbid ruminations; may be classified as minimal risk based on the severity of the depressive symptoms and None.   Homework:   Plan: Follow up with Carey BullocksHeather Nobel Brar, LCSW at [facility] in one week for mental health therapy.

## 2017-06-23 ENCOUNTER — Encounter: Payer: Self-pay | Admitting: Adult Health

## 2017-06-28 ENCOUNTER — Ambulatory Visit: Payer: Self-pay | Admitting: Licensed Clinical Social Worker

## 2017-06-28 ENCOUNTER — Ambulatory Visit: Payer: Self-pay | Admitting: Specialist

## 2017-06-28 DIAGNOSIS — M5440 Lumbago with sciatica, unspecified side: Principal | ICD-10-CM

## 2017-06-28 DIAGNOSIS — F411 Generalized anxiety disorder: Secondary | ICD-10-CM

## 2017-06-28 DIAGNOSIS — G8929 Other chronic pain: Secondary | ICD-10-CM

## 2017-06-28 NOTE — Progress Notes (Signed)
   Subjective:    Patient ID: Lauren Mueller, female    DOB: 12/04/1955, 62 y.o.   MRN: [id number]  HPI  62 yr old with several yr hx of low back pain but also inguinal pain. Occasional radicular pain posterily to the ankles. 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 She has seen PT in the past; she has GI problems so is unable to tolerate NSAIDs.  Hx of bil CMC pain, R>L. She has a positive grind test R>L. I have discussed treatment with her. [facility].     Review of Systems Deferred    Objective:   Physical Exam  Her gait is normal. She is able to heel/toe walk. She accomplishes tandem gait with some balance issues. She is able to do a partial squat and arises in a monophasic fashion. On inspection shoulders and pelvis are level. Her lower lumbar spine is horizontal. She is able to march in place with normal muscle recruitment and relaxation. She is able to stand on each leg independently with a neg Trendelenburg sign.DTRs 2+ and = at knees and ankles. Toe signs downward. Temp and vibratory sense is intact. MMTs 5/5. Seated SLR neg.        Assessment & Plan:  LBP probably discogenic in nature. X-rays LS spine. Return after xray's.

## 2017-06-28 NOTE — Progress Notes (Signed)
Subjective:  Patient ID: Lauren LarkGrace M Mueller, female   DOB: 04/24/1955, 62 y.o.   MRN: [id number]    Increase emotional regulation  Lauren Mueller  presents with anxiety. Onset of symptoms was last two years with gradually improving improving. Symptoms have been occurringNot influenced by the time of the day.  Symptoms are currently rated moderate. Associated signs and symptoms include: attention difficulties and difficulty sleepingHealth problems Marital or family conflict.   She reports that she has been feeling overwhelmed and stressed. She notes that she is constantly having [place] things: take her daughter to and from work, and her husband to physical therapy. She notes that only one of her kids has their license but the other issue is that their is one car. She notes that she gets frustrated with all the driving and wants to the girls to pull their weight more. She denies suicidal and homicidal thoughts.  Therapeutic Interventions: Cognitive Behavioral therapy was utilized by the clinician focusing on anxiety and effect on normal cognition. Clinician processed with the patient regarding how she has been doing since their last session. Clinician processed with the patient regarding the sources of feeling overwhelmed and stressed. Clinician suggested that the girls that work in [place] can figure out transportation to and from work to give her a break. Clinician encouraged the patient to focus on the positive and that its a temporary situation due to her husband's surgery and recovery time.   Return visit in 1 week.    Effectiveness:  Established problem, stable/improving (1). Progressing It is felt more time is needed for Interventions to work. . Patient is fully Other:  orientated to time and place. Patient's Appropriate into problems. Active. Thought process is  Coherent.Minimal: No identifiable suicidal ideation.  Patients presenting with no risk factors but  with morbid ruminations; may be classified as minimal risk based on the severity of the depressive symptoms and None.   Homework: Practice self care.  Plan: Follow up with Lauren BullocksHeather Allyanna Appleman, LCSW at [facility] in one week or earlier.

## 2017-06-30 ENCOUNTER — Ambulatory Visit
Admission: RE | Admit: 2017-06-30 | Discharge: 2017-06-30 | Disposition: A | Discharge status: Home / Self Care | Payer: Self-pay | Source: Ambulatory Visit | Attending: Specialist | Admitting: Specialist

## 2017-06-30 DIAGNOSIS — M5440 Lumbago with sciatica, unspecified side: Principal | ICD-10-CM

## 2017-06-30 DIAGNOSIS — M4316 Spondylolisthesis, lumbar region: Secondary | ICD-10-CM | POA: Insufficient documentation

## 2017-06-30 DIAGNOSIS — I700 Atherosclerosis of aorta: Secondary | ICD-10-CM | POA: Insufficient documentation

## 2017-06-30 DIAGNOSIS — G8929 Other chronic pain: Secondary | ICD-10-CM | POA: Insufficient documentation

## 2017-07-05 ENCOUNTER — Ambulatory Visit: Payer: Self-pay | Admitting: Licensed Clinical Social Worker

## 2017-07-12 ENCOUNTER — Ambulatory Visit: Payer: Self-pay | Admitting: Licensed Clinical Social Worker

## 2017-07-12 ENCOUNTER — Ambulatory Visit: Payer: Self-pay | Admitting: Specialist

## 2017-07-12 ENCOUNTER — Other Ambulatory Visit: Payer: Self-pay | Admitting: Ophthalmology

## 2017-07-12 DIAGNOSIS — M545 Low back pain: Secondary | ICD-10-CM

## 2017-07-12 NOTE — Progress Notes (Signed)
   Subjective:    Patient ID: Lauren Mueller, female    DOB: 04/10/1955, 62 y.o.   MRN: [id number]  HPI She returns to clinic to review her MRI which shows anterilisthesis of L4 on L5. This measures 3 mm. She also had some degenerative changes of L4/L5 and L5/S1.    Review of Systems     Objective:   Physical Exam deferred       Assessment & Plan:  Assessment: Spondylosis lumbar spine  Plan: We have nothing to offer currently. She has been to PT in the past and will continue with [facility]. Return Prn.

## 2017-07-13 ENCOUNTER — Ambulatory Visit: Payer: Self-pay | Admitting: Licensed Clinical Social Worker

## 2017-07-19 ENCOUNTER — Telehealth: Payer: Self-pay | Admitting: Pharmacy Technician

## 2017-07-19 NOTE — Telephone Encounter (Signed)
Received updated proof of income.  Patient eligible to receive medication assistance at [facility] through 2019, as long as eligibility requirements continue to be met.  [facility]

## 2017-08-22 ENCOUNTER — Ambulatory Visit: Payer: Self-pay | Admitting: Licensed Clinical Social Worker

## 2017-08-22 DIAGNOSIS — F411 Generalized anxiety disorder: Secondary | ICD-10-CM

## 2017-08-23 NOTE — Progress Notes (Signed)
Subjective:  Patient ID: Lauren Mueller, female   DOB: November 04, 1955, 62 y.o.   MRN: [id number]    Increase emotional regulation  [name]  presents with anxiety. Onset of symptoms was the last few years with gradually improving improving. Symptoms have been occurringNot influenced by the time of the day.  Symptoms are currently rated moderate. Associated signs and symptoms include: attention difficulties, highly negative, poor social interaction and irritability.Financial difficulties Health problems Marital or family conflict.   She reports that she has been busy and on the go since her last appointment. She describes feeling exhausted and irritable. She explains that she has one less stressor off her shoulders since her daughter now has a license and a car so she doesn't have to drive to [place] five days a week. She explains that the other source of her frustration is feeling the need to help out any way she can because she can't help out financially and contribute to the household in the way that she would like. She notes that she is planning to sit her daughters down and explain that it's time for them to move out. She explains that her son's do not give her a problem usually and she doesn't worry at all about them. She denies suicidal and homicidal thoughts.   Therapeutic Interventions: Cognitive Behavioral therapy was utilized by the clinician focusing on client's anxiety and affect on normal cognition. Clinician processed with the patient regarding how she has been doing since the last session. Clinician utilized reflective listening by encouraging the patient to ventilate about her feelings towards her current situation. Clinician processed with the patient regarding that she has applied for disability and is doing everything she can do but it's a process with red tape that sometimes takes a year or more. Clinician encouraged the patient to think positively as opposed to negatively about  things. Clinician suggested that the patient speak with her husband on a united front and approach the girls about needing to move out. Clinician explained that it sounds like she spends more time focusing on everyone else's needs and problems rather than practicing self care and focusing on herself.   Return visit in 1 week.    Effectiveness:  Established problem, stable/improving (1). Progressing It is felt more time is needed for Interventions to work. . Patient is fully  Other:  orientated to time and place. Patient's Appropriate into problems. Active. Thought process is  Coherent.Minimal: No identifiable suicidal ideation.  Patients presenting with no risk factors but with morbid ruminations; may be classified as minimal risk based on the severity of the depressive symptoms and None.   Homework: Practice self care and set healthy boundaries.   Plan: Follow up with Carey Bullocks, LCSW at [facility] in one week or earlier if needed.

## 2017-08-29 ENCOUNTER — Telehealth: Payer: Self-pay | Admitting: Pharmacist

## 2017-08-29 ENCOUNTER — Ambulatory Visit: Payer: Self-pay | Admitting: Licensed Clinical Social Worker

## 2017-08-29 DIAGNOSIS — F331 Major depressive disorder, recurrent, moderate: Secondary | ICD-10-CM

## 2017-08-29 NOTE — Progress Notes (Signed)
Subjective:  Patient ID: Lauren Mueller, female   DOB: 25-Jan-1956, 62 y.o.   MRN: [id number]    Increase emotional regulation  Mrs. Dhondt  presents with depression. Onset of symptoms was last few years with gradually improving improving. Symptoms have been occurringNot influenced by the time of the day.  Symptoms are currently rated moderate. Associated signs and symptoms include: attention difficulties, highly negative, poor social interaction, sadness and stubbornFinancial difficulties Marital or family conflict.   She reports that she has been doing okay. She explains that things are the same at home. She explains that she is not as depressed since her husband was approved for disability but there is a delay on when the first payment will be sent out to him. She notes that she feels stressed, down, and frustrated because she is not used to having to rely on the kids financially. She notes that she wants them to move forward and be adults. She explains that its hard to need their help because she is not in the position to work. She explains that she has still not heard anything regarding her disability. She denies suicidal and homicidal thoughts.   Therapeutic Interventions: Cognitive Behavioral therapy was utilized by the clinician focusing on symptoms of depression. Clinician processed with the patient regarding how things have been going since the last session. Clinician encouraged the patient to focus on positive things rather than negative things. Clinician explained that her situation in terms of having to rely on the kids financially is temporary since now her husband was awarded disability. Clinician explained that she spent many years raising and making sure that her children's needs were met so its there turn to help pitch in. Clinician encouraged the patient to practice self care and set healthy boundaries with her family.   Return visit in 1 week.    Effectiveness:  Established  problem, stable/improving (1). Progressing It is felt more time is needed for Interventions to work.  . Patient is fullyOther:  orientated to time and place. Patient's Appropriate into problems. Active. Thought process is  Coherent.Minimal: No identifiable suicidal ideation.  Patients presenting with no risk factors but with morbid ruminations; may be classified as minimal risk based on the severity of the depressive symptoms and None.   Homework:   Plan: Follow up with Julian Hy, LCSW at [facility] in one week or earlier.

## 2017-08-29 NOTE — Telephone Encounter (Signed)
08/29/2017 1:51:49 PM - Levemir Vials-paperwork  08/29/17 I have received the provider portion of [employer] application, holding for patient to return her portion, mailed to patient 08/15/17.Forde Radon

## 2017-09-04 ENCOUNTER — Telehealth: Payer: Self-pay | Admitting: Pharmacist

## 2017-09-04 NOTE — Telephone Encounter (Signed)
09/04/2017 12:10:16 PM - Levemir Vials  09/04/17 Faxed [employer] application for [employer]ials Inject 30 units under the skin daily at bedtime.Forde RadonAJ

## 2017-09-05 ENCOUNTER — Ambulatory Visit: Payer: Self-pay | Admitting: Licensed Clinical Social Worker

## 2017-09-21 ENCOUNTER — Ambulatory Visit: Payer: Self-pay | Admitting: Licensed Clinical Social Worker

## 2017-09-26 ENCOUNTER — Ambulatory Visit: Payer: Self-pay | Admitting: Licensed Clinical Social Worker

## 2017-09-26 DIAGNOSIS — F411 Generalized anxiety disorder: Secondary | ICD-10-CM

## 2017-09-26 DIAGNOSIS — F331 Major depressive disorder, recurrent, moderate: Secondary | ICD-10-CM

## 2017-09-26 NOTE — Progress Notes (Signed)
Subjective:  Patient ID: Lauren Mueller, female   DOB: 05/30/1955, 62 y.o.   MRN: [id number]    Increase emotional regulation  Ms. Silverthorn  presents with depression and anxiety. Onset of symptoms was in the last few years with gradually improving improving. Symptoms have been occurringNot influenced by the time of the day.  Symptoms are currently rated moderate. Associated signs and symptoms include: attention difficulties, highly negative, poor social interaction, sadness and irritability.Financial difficulties Health problems Marital or family conflict.   She reports that she has been doing okay. She explains that she now has a place where she can go to, in order to hide when her nerves are shot, and she needs space to herself. She notes that she now utilizes her bedroom closet to go to until she calms down. She explains that she is exhausted with her daughters constantly bringing friends over and they will allow them to stay late. She notes that she is frustrated with not being able to feel like she can dress how she wants to without the fear of company being in her living room. She notes that its hard because everyone depends on her so she can't show her emotions when she is upset or worried about things. She reports that until her husband finally receives his first disability check that they are struggling financially. She denies suicidal and homicidal thoughts.  Therapeutic Interventions: Cognitive Behavioral therapy was utilized by the clinician by providing emotional support and encouragement to the patient.Clinician utilized reflective listening encouraging the client to ventilate about her feelings towards her current situation. Clinician processed with the patient regarding that she is glad that she has a place to go to in order to relax and stay calm. Clinician explained that its unfortunate that she is unable to roam around her house freely without running into company. Clinician suggested  that she could set a house rule that the girls can only have one friend over at a time. Clinician explained that she can also set a rule that no company is allowed in the house after a certain time in the evenings. Clinician encouraged the patient to focus on gratitude, what she does have versus what she doesn't have. Clinician processed with the patient regarding that he has been awarded disability that they are just waiting on the first check.   Return visit in 1 week.    Effectiveness:  Established problem, stable/improving (1). Progressing It is felt more time is needed for Interventions to work.  . Patient is fully or not fully Other:  orientated to time and place. Patient's Appropriate into problems. Active. Thought process is  Coherent.Minimal: No identifiable suicidal ideation.  Patients presenting with no risk factors but with morbid ruminations; may be classified as minimal risk based on the severity of the depressive symptoms and None.   Homework: ChiropodistUtilize relaxation techniques.   Plan: Follow up with Carey BullocksHeather Caliber Landess, LCSW at [facility] in one week or earlier if needed.

## 2017-10-03 ENCOUNTER — Ambulatory Visit: Payer: Self-pay | Admitting: Licensed Clinical Social Worker

## 2017-10-03 ENCOUNTER — Other Ambulatory Visit: Payer: Self-pay

## 2017-10-03 DIAGNOSIS — F411 Generalized anxiety disorder: Secondary | ICD-10-CM

## 2017-10-03 MED ORDER — ATORVASTATIN CALCIUM 20 MG PO TABS: 20.0000 mg | ORAL_TABLET | Freq: Every day | ORAL | 0 refills | Status: DC

## 2017-10-03 NOTE — Progress Notes (Signed)
Subjective:  Patient ID: Lauren Mueller, female   DOB: Apr 23, 1955, 62 y.o.   MRN: [id number]    Increase emotional regulation  Ms. Shawntae  presents with anxiety. Onset of symptoms was in the last two years with gradually improving improving. Symptoms have been occurringNot influenced by the time of the day.  Symptoms are currently rated marked. Associated signs and symptoms include: attention difficulties, sadness and difficulty concentrating. Financial difficulties Health problems Marital or family conflict.   She discussed situational stressors that are affecting her day to day life. She describes being unable to relax and becoming easily frustrated with little things. She notes that she has a difficulty concentrating and is often short with others. She denies suicidal and homicidal thoughts.   Therapeutic Interventions: Cognitive Behavioral therapy was utilized by the clinician focusing on patient's anxiety and affect on normal cognition. Clinician discussed the problems that the client is experiencing regarding everyday situational stressors. Clinician provided psycho education that when under stress that its difficult to think rationally and focus on things in a appropriate manner. Clinician explained that the use of relaxation techniques can assist in overall reducing her anxiety symptoms and bringing her back to the present moment. Clinician provided a handout and went over mindfulness meditation concepts with the patient to utilized when stressed and anxious.   Return visit in 1 week.    Effectiveness: Established problem, stable/improving (1). Progressing It is felt more time is needed for Interventions to work.   Met:  It is felt more time is needed for interventions to work.   2.  Goal (s): Patient is making progress towards to her goal of decreasing her overall stress and anxiety by learning to managing her symptoms through coping skills.  . Patient is fully or not fully Other:   orientated to time and place. Patient's Appropriate into problems. Active. Thought process is  Coherent.Minimal: No identifiable suicidal ideation.  Patients presenting with no risk factors but with morbid ruminations; may be classified as minimal risk based on the severity of the depressive symptoms and None.   Homework: Practice mindfulness meditation techniques learning in today's session.   Plan: Follow up with Julian Hy, LCSW at [facility] in one week or earlier if needed.

## 2017-10-11 ENCOUNTER — Ambulatory Visit: Payer: Self-pay | Admitting: Specialist

## 2017-10-11 ENCOUNTER — Ambulatory Visit: Payer: Self-pay | Admitting: Licensed Clinical Social Worker

## 2017-10-11 DIAGNOSIS — M25519 Pain in unspecified shoulder: Secondary | ICD-10-CM

## 2017-10-11 DIAGNOSIS — F331 Major depressive disorder, recurrent, moderate: Secondary | ICD-10-CM

## 2017-10-11 DIAGNOSIS — F411 Generalized anxiety disorder: Secondary | ICD-10-CM

## 2017-10-11 NOTE — Progress Notes (Signed)
   Subjective:    Patient ID: Lauren Mueller, female    DOB: 04/08/1955, 62 y.o.   MRN: [id number]  HPI Ms Si GaulDupree comes in with questions about her medication and whether there is anything better than the Tylenol she takes and the answer is "No".  She has tried NSAID's to no avail.  She does not wish to be on opioids. I have answered her questions and she will return PRN.   Review of Systems     Objective:   Physical Exam        Assessment & Plan:

## 2017-10-11 NOTE — Progress Notes (Signed)
Subjective:  Patient ID: Lauren Mueller, female   DOB: 05/03/1955, 62 y.o.   MRN: [id number]    Increase emotional regulation  Lauren Mueller presents with depression and anxiety. Onset of symptoms was in the last two years with gradually improving improving. Symptoms have been occurringNot influenced by the time of the day.  Symptoms are currently rated moderate. Associated signs and symptoms include: sadness and irritability.Financial difficulties Marital or family conflict Other: chronic pain.   She reports that she has been doing okay since the last session. She notes that she is trying not to complain and focus on the positive. She notes that her visit with Dr. Justice RocherFossier went well and he told her that the only kind of pain medications that could potentially help are the ones that the clinic unfortunately does not prescribe. She notes that they found out that her husband's disability money is at the processing center but for some reason has yet to be distributed. She notes that her daughter apparently has to fill out a form stating that she received a pay out for while being at [employer] when she had already finished and that she wasn't supposed to get that money. She notes that it was a mistake and they have documentation to prove it. She notes that her son is getting married next August and her other son who is the best man has been blowing up her phone. She notes that her girls are still getting on her nerves. She reports that she has been utilizing her spot in the closet hwen she needs to. She denies suicidal and homicidal thoughts.   Therapeutic Interventions: Cognitive Behavioral therapy was utilized by the clinician focusing on her anxiety and depression's affect on normal cognition. Clinician processed with the patient regarding how she has been doing since the last session. Clinician processed with the patient regarding how her appointment with Dr. Justice RocherFossier went. Clinician processed with the patient  regarding that she may have better luck letting her husband's attorney know what the situation is and seeing if he or she is able to find out the pertinent information for the delay. Clinician explained that it sounds like the delay has to do with her daughter and needing to prove that they did not take advantage of state funding towards her daughter's education. Clinician congratulated the patient on her son's engagement. Clinician processed with the patient regarding how she has been coping with things.   Return visit in 1 week.    Effectiveness:  Established problem, stable/improving (1). Progressing It is felt more time is needed for Interventions to work.  . Patient is fully or not fully Other:  orientated to time and place. Patient's Appropriate into problems. Active. Thought process is  Coherent.Minimal: No identifiable suicidal ideation.  Patients presenting with no risk factors but with morbid ruminations; may be classified as minimal risk based on the severity of the depressive symptoms and None.   Homework: Estate manager/land agentUtilize coping skills.   Plan: Follow up with Lauren BullocksHeather Sarae Nicholes, LCSW at [facility] in one week or earlier.

## 2017-10-12 ENCOUNTER — Ambulatory Visit: Payer: Self-pay | Admitting: Adult Health

## 2017-10-18 ENCOUNTER — Ambulatory Visit: Payer: Self-pay | Admitting: Licensed Clinical Social Worker

## 2017-11-02 ENCOUNTER — Telehealth: Payer: Self-pay | Admitting: Licensed Clinical Social Worker

## 2017-11-02 NOTE — Telephone Encounter (Signed)
Clinician left voicemail with patient regarding missing her last appointment and seeing if she wanted to reschedule.

## 2017-11-09 ENCOUNTER — Ambulatory Visit: Payer: Self-pay | Admitting: Family Medicine

## 2017-11-09 VITALS — BP 128/71 | HR 82 | Temp 98.3°F | Ht 62.25 in | Wt 219.8 lb

## 2017-11-09 DIAGNOSIS — E1169 Type 2 diabetes mellitus with other specified complication: Secondary | ICD-10-CM

## 2017-11-09 DIAGNOSIS — I10 Essential (primary) hypertension: Secondary | ICD-10-CM

## 2017-11-09 DIAGNOSIS — E785 Hyperlipidemia, unspecified: Secondary | ICD-10-CM

## 2017-11-09 DIAGNOSIS — E119 Type 2 diabetes mellitus without complications: Secondary | ICD-10-CM

## 2017-11-09 DIAGNOSIS — Z794 Long term (current) use of insulin: Secondary | ICD-10-CM

## 2017-11-09 MED ORDER — LISINOPRIL 40 MG PO TABS: 40.0000 mg | ORAL_TABLET | Freq: Every day | ORAL | 1 refills | Status: DC

## 2017-11-09 MED ORDER — HYDROCHLOROTHIAZIDE 25 MG PO TABS: 25.0000 mg | ORAL_TABLET | Freq: Every day | ORAL | 1 refills | Status: DC

## 2017-11-09 MED ORDER — ATORVASTATIN CALCIUM 20 MG PO TABS
20.0000 mg | ORAL_TABLET | Freq: Every day | ORAL | 1 refills | Status: DC
Start: 2017-11-09 — End: 2022-07-04

## 2017-11-09 MED ORDER — INSULIN DETEMIR 100 UNIT/ML ~~LOC~~ SOLN: 30.0000 [IU] | Freq: Every day | SUBCUTANEOUS | 11 refills | Status: DC

## 2017-11-09 MED ORDER — METFORMIN HCL 1000 MG PO TABS: 1000.0000 mg | ORAL_TABLET | Freq: Two times a day (BID) | ORAL | 1 refills | Status: DC

## 2017-11-09 MED ORDER — PIOGLITAZONE HCL 45 MG PO TABS: 45.0000 mg | ORAL_TABLET | Freq: Every day | ORAL | 1 refills | Status: DC

## 2017-11-09 MED ORDER — INSULIN LISPRO 100 UNIT/ML ~~LOC~~ SOLN: 6.0000 [IU] | Freq: Three times a day (TID) | SUBCUTANEOUS | 11 refills | Status: DC

## 2017-11-09 MED ORDER — AMLODIPINE BESYLATE 10 MG PO TABS
10.0000 mg | ORAL_TABLET | Freq: Every day | ORAL | 1 refills | Status: DC
Start: 2017-11-09 — End: 2023-03-06

## 2017-11-09 NOTE — Assessment & Plan Note (Signed)
Under good control. Continue current regimen. Continue to monitor. Refills given today. Labs checked today. Call with any concerns.  

## 2017-11-09 NOTE — Progress Notes (Signed)
BP 128/71   Pulse 82   Temp 98.3 F (36.8 C)   Ht 5' 2.25" (1.581 m)   Wt 219 lb 12.8 oz (99.7 kg)   BMI 39.88 kg/m    Subjective:    Patient ID: Lauren Mueller, female    DOB: 1956-01-26, 62 y.o.   MRN: [id number]  HPI: Lauren Mueller is a 62 y.o. female  Chief Complaint  Patient presents with  . Medication Refill    Metformin   DIABETES Hypoglycemic episodes:no Polydipsia/polyuria: no Visual disturbance: yes Chest pain: no Paresthesias: no Glucose Monitoring: yes  Accucheck frequency: TID  Fasting glucose: 120s-170s Taking Insulin?: yes  Long acting insulin: levemir 30units daily  Short acting insulin: humalog 6 units TID with meals Blood Pressure Monitoring: not checking  HYPERTENSION / HYPERLIPIDEMIA Satisfied with current treatment? yes Duration of hypertension: chronic BP monitoring frequency: not checking BP medication side effects: no Past BP meds: amlodipine, carvedilol, hctz, lisinopril Duration of hyperlipidemia: chronic Cholesterol medication side effects: no Cholesterol supplements: none Past cholesterol medications: atorvastatin Medication compliance: excellent compliance Aspirin: no Recent stressors: no Recurrent headaches: no Visual changes: no Palpitations: no Dyspnea: no Chest pain: no Lower extremity edema: yes Dizzy/lightheaded: no   Relevant past medical, surgical, family and social history reviewed and updated as indicated. Interim medical history since our last visit reviewed. Allergies and medications reviewed and updated.  Review of Systems  Constitutional: Negative.   Respiratory: Negative.   Cardiovascular: Negative.   Musculoskeletal: Positive for arthralgias and joint swelling. Negative for back pain, gait problem, myalgias, neck pain and neck stiffness.  Skin: Negative.   Neurological: Negative.   Psychiatric/Behavioral: Negative.     Per HPI unless specifically indicated above     Objective:    BP 128/71    Pulse 82   Temp 98.3 F (36.8 C)   Ht 5' 2.25" (1.581 m)   Wt 219 lb 12.8 oz (99.7 kg)   BMI 39.88 kg/m   Wt Readings from Last 3 Encounters:  11/09/17 219 lb 12.8 oz (99.7 kg)  06/22/17 213 lb (96.6 kg)  05/31/17 218 lb (98.9 kg)    Physical Exam  Constitutional: She is oriented to person, place, and time. She appears well-developed and well-nourished. No distress.  HENT:  Head: Normocephalic and atraumatic.  Right Ear: Hearing normal.  Left Ear: Hearing normal.  Nose: Nose normal.  Eyes: Conjunctivae and lids are normal. Right eye exhibits no discharge. Left eye exhibits no discharge. No scleral icterus.  Pulmonary/Chest: Effort normal. No respiratory distress.  Musculoskeletal: Normal range of motion.  Neurological: She is alert and oriented to person, place, and time.  Skin: Skin is intact. No rash noted.  Psychiatric: She has a normal mood and affect. Her speech is normal and behavior is normal. Judgment and thought content normal. Cognition and memory are normal.    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 Continue current regimen. Continue to monitor. Refills given today. Labs checked today. Call with any concerns.       Relevant Medications   amLODipine (NORVASC) 10 MG tablet   atorvastatin (LIPITOR) 20 MG tablet   hydrochlorothiazide (HYDRODIURIL) 25  MG tablet   lisinopril (PRINIVIL,ZESTRIL) 40 MG tablet   Other Relevant Orders   Comprehensive metabolic panel     Endocrine   Type 2 diabetes mellitus (HCC)    Last A1c 4 Months ago was down to 9.6 from 13.4! Doing better with blood sugars. Checking A1c today and will have her return to go over labs in about a week. Will adjust medication at  that time. Refill given today. Call with any concerns.       Relevant Medications   atorvastatin (LIPITOR) 20 MG tablet   insulin detemir (LEVEMIR) 100 UNIT/ML injection   insulin lispro (HUMALOG) 100 UNIT/ML injection   lisinopril (PRINIVIL,ZESTRIL) 40 MG tablet   metFORMIN (GLUCOPHAGE) 1000 MG tablet   pioglitazone (ACTOS) 45 MG tablet   Other Relevant Orders   Hgb A1c w/o eAG   Comprehensive metabolic panel   CBC with Differential/Platelet   Hyperlipidemia associated with type 2 diabetes mellitus (HCC)    Under good control. Continue current regimen. Continue to monitor. Refills given today. Labs checked today. Call with any concerns.       Relevant Medications   amLODipine (NORVASC) 10 MG tablet   atorvastatin (LIPITOR) 20 MG tablet   hydrochlorothiazide (HYDRODIURIL) 25 MG tablet   insulin detemir (LEVEMIR) 100 UNIT/ML injection   insulin lispro (HUMALOG) 100 UNIT/ML injection   lisinopril (PRINIVIL,ZESTRIL) 40 MG tablet   metFORMIN (GLUCOPHAGE) 1000 MG tablet   pioglitazone (ACTOS) 45 MG tablet   Other Relevant Orders   Comprehensive metabolic panel   Lipid Panel w/o Chol/HDL Ratio    Other Visit Diagnoses    Diabetes mellitus without complication (HCC)       Relevant Medications   atorvastatin (LIPITOR) 20 MG tablet   hydrochlorothiazide (HYDRODIURIL) 25 MG tablet   insulin detemir (LEVEMIR) 100 UNIT/ML injection   insulin lispro (HUMALOG) 100 UNIT/ML injection   lisinopril (PRINIVIL,ZESTRIL) 40 MG tablet   metFORMIN (GLUCOPHAGE) 1000 MG tablet   pioglitazone (ACTOS) 45 MG tablet       Follow up plan: Return in about 1 week (around 11/16/2017) for follow up labs/med adjustment, then 3 months- DM follow up.

## 2017-11-09 NOTE — Assessment & Plan Note (Signed)
Under good control. Continue current regimen. Continue to monitor. Refills given today. Labs checked today. Call with any concerns.

## 2017-11-09 NOTE — Assessment & Plan Note (Signed)
Last A1c 4 Months ago was down to 9.6 from 13.4! Doing better with blood sugars. Checking A1c today and will have her return to go over labs in about a week. Will adjust medication at that time. Refill given today. Call with any concerns.

## 2017-11-10 LAB — COMPREHENSIVE METABOLIC PANEL
ALT: 10 IU/L (ref 0–32)
AST: 10 IU/L (ref 0–40)
Albumin/Globulin Ratio: 1.6 (ref 1.2–2.2)
Albumin: 4.2 g/dL (ref 3.6–4.8)
Alkaline Phosphatase: 93 IU/L (ref 39–117)
BUN/Creatinine Ratio: 17 (ref 12–28)
BUN: 23 mg/dL (ref 8–27)
Bilirubin Total: <0.2 mg/dL (ref 0.0–1.2)
CALCIUM: 9.4 mg/dL (ref 8.7–10.3)
CO2: 25 mmol/L (ref 20–29)
Chloride: 103 mmol/L (ref 96–106)
Creatinine, Ser: 1.38 mg/dL — ABNORMAL HIGH (ref 0.57–1.00)
GFR calc Af Amer: 48 mL/min/{1.73_m2} — ABNORMAL LOW (ref >59)
GFR calc non Af Amer: 42 mL/min/{1.73_m2} — ABNORMAL LOW (ref >59)
GLOBULIN, TOTAL: 2.7 g/dL (ref 1.5–4.5)
Glucose: 246 mg/dL — ABNORMAL HIGH (ref 65–99)
Potassium: 3.9 mmol/L (ref 3.5–5.2)
SODIUM: 142 mmol/L (ref 134–144)
Total Protein: 6.9 g/dL (ref 6.0–8.5)

## 2017-11-10 LAB — CBC WITH DIFFERENTIAL/PLATELET
BASOS: 0 %
Basophils Absolute: 0 10*3/uL (ref 0.0–0.2)
EOS (ABSOLUTE): 0.1 10*3/uL (ref 0.0–0.4)
Eos: 1 %
HEMOGLOBIN: 11.7 g/dL (ref 11.1–15.9)
Hematocrit: 36.1 % (ref 34.0–46.6)
IMMATURE GRANS (ABS): 0 10*3/uL (ref 0.0–0.1)
IMMATURE GRANULOCYTES: 0 %
LYMPHS: 41 %
Lymphocytes Absolute: 3.3 10*3/uL — ABNORMAL HIGH (ref 0.7–3.1)
MCH: 26.5 pg — ABNORMAL LOW (ref 26.6–33.0)
MCHC: 32.4 g/dL (ref 31.5–35.7)
MCV: 82 fL (ref 79–97)
MONOS ABS: 0.6 10*3/uL (ref 0.1–0.9)
Monocytes: 7 %
NEUTROS PCT: 51 %
Neutrophils Absolute: 4.1 10*3/uL (ref 1.4–7.0)
Platelets: 410 10*3/uL (ref 150–450)
RBC: 4.42 x10E6/uL (ref 3.77–5.28)
RDW: 15.8 % — ABNORMAL HIGH (ref 12.3–15.4)
WBC: 8.2 10*3/uL (ref 3.4–10.8)

## 2017-11-10 LAB — LIPID PANEL W/O CHOL/HDL RATIO
Cholesterol, Total: 158 mg/dL (ref 100–199)
HDL: 49 mg/dL (ref >39)
LDL CALC: 73 mg/dL (ref 0–99)
Triglycerides: 182 mg/dL — ABNORMAL HIGH (ref 0–149)
VLDL CHOLESTEROL CAL: 36 mg/dL (ref 5–40)

## 2017-11-10 LAB — HGB A1C W/O EAG: Hgb A1c MFr Bld: 11.4 % — ABNORMAL HIGH (ref 4.8–5.6)

## 2017-11-15 ENCOUNTER — Ambulatory Visit: Payer: Self-pay | Admitting: Licensed Clinical Social Worker

## 2017-11-15 DIAGNOSIS — F331 Major depressive disorder, recurrent, moderate: Secondary | ICD-10-CM

## 2017-11-15 DIAGNOSIS — F411 Generalized anxiety disorder: Secondary | ICD-10-CM

## 2017-11-15 NOTE — Progress Notes (Signed)
Subjective:  Patient ID: [name], female   DOB: 08/30/1955, 62 y.o.   MRN: [id number]    Increase emotional regulation  Lauren Mueller  presents with depression and anxiety. Onset of symptoms was in the last few years with gradually improving improving. Symptoms have been occurringNot influenced by the time of the day.  Symptoms are currently rated moderate. Associated signs and symptoms include: anger, attention difficulties, sadness, stubborn and irritability.Financial difficulties Health problems Marital or family conflict.   She reports that not much as changed since the last time she was here. She explains that her plan is to put her girls out as soon as her husband receives his disability. She explains that there are too many people in the apartment and her husband agrees with her that its time for the girls to move out. She explains that at the moment, the only reason the girls are still there is because they need their income. She notes that she is trying to stay positive, pray, and keeping her faith that things will improve. She notes that she is irritable and snappy a good portion of the time. She denies suicidal and homicidal thoughts.   Therapeutic Interventions: Cognitive Behavioral therapy was utilized by the clinician focusing on her mood by providing emotional support and encouragement. Clinician processed with the patient regarding how things have been going since the last session. Clinician processed with the patient regarding how things are at home. Clinician explains that despite her irritability and being snappy that it's best that she try to remain positive. Clinician explained that her husband has been awarded disability and eventually he will receive his payments. Clinician explained that she is glad that she is able to pray and is keeping her faith. Clinician encouraged the patient to utilize the coping skills that they have discussed at previous sessions.   Return visit in 1  week.    Effectiveness:  Review of last therapy session (1). Progressing It is felt more time is needed for Interventions to work.  . Patient is fully Other:  orientated to time and place. Patient's Appropriate into problems. Active. Thought process is  Coherent.Minimal: No identifiable suicidal ideation.  Patients presenting with no risk factors but with morbid ruminations; may be classified as minimal risk based on the severity of the depressive symptoms and None.   Homework: Estate manager/land agentUtilize coping skills.  Plan: Follow up with [name], LCSW at [facility] in one week or earlier if needed.

## 2017-11-16 ENCOUNTER — Ambulatory Visit: Payer: Self-pay | Admitting: Adult Health Nurse Practitioner

## 2017-11-16 DIAGNOSIS — E119 Type 2 diabetes mellitus without complications: Secondary | ICD-10-CM

## 2017-11-16 MED ORDER — METFORMIN HCL 1000 MG PO TABS: 1000.0000 mg | ORAL_TABLET | Freq: Two times a day (BID) | ORAL | 1 refills | Status: DC

## 2017-11-16 MED ORDER — INSULIN DETEMIR 100 UNIT/ML ~~LOC~~ SOLN: 40.0000 [IU] | Freq: Every day | SUBCUTANEOUS | 11 refills | Status: DC

## 2017-11-16 NOTE — Progress Notes (Signed)
Subjective:    Patient ID: Lauren Mueller, female    DOB: 02-Jan-1956, 62 y.o.   MRN: [id number]  HPI  Lauren Mueller is a 62 yo F here for f/u of lab results.  DM: she endorses she is taking all of her meds as rx. Pt denies symptoms of Hypoglycemia. She endorses checking her feet and reports edema and tenderness in her feet.  HTN: BP elevated at 147/79.    Patient Active Problem List   Diagnosis Date Noted  . Depression, major, single episode, moderate ([place]) 03/23/2017  . Hyperlipidemia associated with type 2 diabetes mellitus ([place]) 01/05/2017  . Hypertension 12/29/2016  . Cervical radiculopathy at C5 10/30/2014  . Right arm weakness 10/21/2014  . Right rotator cuff tear 10/21/2014  . Adult BMI 30+ 01/09/2014  . Supraventricular tachycardia ([place]) 01/09/2014  . Type 2 diabetes mellitus ([place]) 01/09/2014   Allergies as of 11/16/2017   No Known Allergies     Medication List        Accurate as of 11/16/17  7:03 PM. Always use your most recent med list.          acetaminophen 500 MG tablet Commonly known as:  TYLENOL Take 500 mg by mouth every 6 (six) hours as needed.   amLODipine 10 MG tablet Commonly known as:  NORVASC Take 1 tablet (10 mg total) by mouth daily.   atorvastatin 20 MG tablet Commonly known as:  LIPITOR Take 1 tablet (20 mg total) by mouth daily.   blood glucose meter kit and supplies Kit Dispense based on patient and insurance preference. Use up to four times daily as directed. (FOR ICD-9 250.00, 250.01).   carvedilol 25 MG tablet Commonly known as:  COREG Take 1 tablet (25 mg total) by mouth 2 (two) times daily with a meal.   hydrochlorothiazide 25 MG tablet Commonly known as:  HYDRODIURIL Take 1 tablet (25 mg total) by mouth daily.   insulin detemir 100 UNIT/ML injection Commonly known as:  LEVEMIR Inject 0.4 mLs (40 Units total) into the skin at bedtime.   insulin lispro 100 UNIT/ML injection Commonly known as:  HUMALOG Inject 0.06 mLs (6  Units total) into the skin 3 (three) times daily before meals.   lisinopril 40 MG tablet Commonly known as:  PRINIVIL,ZESTRIL Take 1 tablet (40 mg total) by mouth daily.   metFORMIN 1000 MG tablet Commonly known as:  GLUCOPHAGE Take 1 tablet (1,000 mg total) by mouth 2 (two) times daily with a meal.   pioglitazone 45 MG tablet Commonly known as:  ACTOS Take 1 tablet (45 mg total) by mouth daily.        Review of Systems  All other systems reviewed and are negative.  Creatinine is elevated at 1.38 from 1.08 - it has been stage 3 for 1 year. A1C is elevated at 11.4 from 9.6.     Objective:   Physical Exam  Constitutional: She is oriented to person, place, and time. She appears well-developed and well-nourished.  Cardiovascular: Normal rate, regular rhythm and normal heart sounds.  Pulmonary/Chest: Effort normal and breath sounds normal.  Abdominal: Soft. Bowel sounds are normal.  Neurological: She is alert and oriented to person, place, and time.  Skin: Skin is warm and dry.  Psychiatric: She has a normal mood and affect. Her behavior is normal. Judgment and thought content normal.  Vitals reviewed.   BP (!) 147/79   Pulse 65   Temp 98.2 F (36.8 C)   Ht 5'  4" (1.626 m)   Wt 221 lb 4.8 oz (100.4 kg)   BMI 37.99 kg/m        Assessment & Plan:   DM: Not well controlled. A1C increased to 11.4.  GFR is 48. If it continues to drop, will need to decrease Metformin.  Increase Levemir to 40 u  Encouraged pt to watch diet and increase physical activity.   HTN: Elevated tonight. Continue current medications, will reassess at next OV.   F/u in 3 mo w/ labs 1 week prior to monitor DM and HTN.

## 2017-11-17 ENCOUNTER — Telehealth: Payer: Self-pay | Admitting: Pharmacist

## 2017-11-17 NOTE — Telephone Encounter (Signed)
11/17/2017 11:12:16 AM - Levemir DOSE INCREASE  11/17/17 Received a pharmacy printout for Levemir Vials Inject 40 units under the skin daily at bedtime #5. I will print off Novo Nordisk refill request for Dose Increase-[facility] and will send to [employer] [place]

## 2017-11-23 ENCOUNTER — Ambulatory Visit: Payer: Self-pay | Admitting: Licensed Clinical Social Worker

## 2017-11-23 DIAGNOSIS — F331 Major depressive disorder, recurrent, moderate: Secondary | ICD-10-CM

## 2017-11-23 DIAGNOSIS — F411 Generalized anxiety disorder: Secondary | ICD-10-CM

## 2017-11-23 NOTE — Progress Notes (Signed)
Subjective:  Patient ID: Lauren LarkGrace M Mueller, female   DOB: 01/13/1956, 62 y.o.   MRN: [id number]    Increase emotional regulation  Lauren Mueller  presents with depression. Onset of symptoms was several years with gradually improving improving. Symptoms have been occurringNot influenced by the time of the day.  Symptoms are currently rated moderate. Associated signs and symptoms include: attention difficulties, poor social interaction, sadness, stubborn and irritability.Financial difficulties Health problems Marital or family conflict.   She reports that she finally heard from disability. She explains that social security sent her something telling her about a hearing date for disability and told her to apply for adult Medicaid. She explains that her husband had a few words with her girl's guy friends and put out all the boys out. She explains that its been a mess because her husband has been on edge because he hasn't gotten his disability payments yet. She notes that the one of the twins after the argument said they will be moving out in December so she can save up her money to move. She explains that when they don't have money for things that they have to ask their girls. She explains that she just feels like the uber drive until things get better. She notes that her mood is short with everyone. She denies suicidal and homicidal thoughts.   Therapeutic Interventions: Cognitive Behavioral therapy was utilized by the clinician by providing encouragement and support to the patient. Clinician processed with the patient regarding how she has been doing since the last session. Clinician explained to the patient that it sounds like her luck is finally changing and she received some good news. Clinician processed with the patient regarding that regarding how things have been going at home. Clinician explained that she is glad that her husband has set some limits in the apartment for when and if the girls have friends  over. Clinician explained that her husband will eventually receive his disability payments and things will turn around. Clinician explained that it sounds like the girls want more independence and freedom so moving out is the best option for them. Clinician explained that even though her daughters won't move out until December that at least they have a plan are going to move forward with it. Clinician explained that the rut that she and her family are in is temporary, things will improve. Clinician explained that staying positive and focusing on what she does have versus what she doesn't have can help to improve her mood some. Clinician encouraged the patient to practice self care.   Return visit in 1 week.    Effectiveness:  Review of last therapy session (1). Progressing It is felt more time is needed for Interventions to work.  . Patient is fully  Other:  orientated to time and place. Patient's Appropriate into problems. Active. Thought process is  Coherent.Minimal: No identifiable suicidal ideation.  Patients presenting with no risk factors but with morbid ruminations; may be classified as minimal risk based on the severity of the depressive symptoms and None.   Homework:  Practice self care and set healthy boundaries with family.   Plan: Follow up with Carey BullocksHeather Demaurion Dicioccio, LCSW at [facility] in one week or earlier if needed.

## 2017-11-30 ENCOUNTER — Ambulatory Visit: Payer: Self-pay | Admitting: Licensed Clinical Social Worker

## 2017-11-30 DIAGNOSIS — F331 Major depressive disorder, recurrent, moderate: Secondary | ICD-10-CM

## 2017-11-30 NOTE — Progress Notes (Signed)
Subjective:  Patient ID: Lauren Mueller, female   DOB: 12/20/1955, 62 y.o.   MRN: [id number]    Increase emotional regulation  [name]  presents with depression. Onset of symptoms was several years with gradually improving improving. Symptoms have been occurringNot influenced by the time of the day.  Symptoms are currently rated moderate. Associated signs and symptoms include: attention difficulties, stubborn and irritability.Financial difficulties Health problems Marital or family conflict.   She reports that she has been in a little bit more pain than usual due to hitting her foot on the bed six days ago. She explains that her extended family has been having some issues and is frustrated that her husband wants to take in one of his nieces. She notes that there isn't enough room and he doesn't have to rescue everyone. She notes that she is constantly aggravated with the girls. She notes that they don't help out with laundry, cleaning the kitchen, dishes, or their bathroom. She notes that she is over the girls constantly being dependent on her and is ready for them to move out. She notes that her mood has been alright. She explains that she will sit and watch TV when she's not doing something for someone else. She explains that she will go to church sometimes on Sunday mornings to the early service. She denies suicidal and homicidal thoughts.   Therapeutic Interventions: Cognitive Behavioral therapy was utilized by the clinician focusing on the patient's depression by providing emotional support and encouragement to the patient. Clinician processed with the patient regarding how she has been doing since the last session. Clinician processed with the patient regarding that it sounds like her husband just wants to make sure his niece is taken care of. Clinician processed with the patient regarding how things have been going at home. Clinician processed with the patient regarding that if she stops doing  certain things for the girls then they will be forced to fend for themselves. Clinician asked the patient what she is doing for enjoyment. Clinician explained that she could read a book, check out dvd's from [employer], or get out of the house some for a walk.   Return visit in 1 week.    Effectiveness:  Review of last therapy session (1). Progressing It is felt more time is needed for Interventions to work.  . Patient is fully  Other:  orientated to time and place. Patient's Appropriate into problems. Active. Thought process is  Coherent.Minimal: No identifiable suicidal ideation.  Patients presenting with no risk factors but with morbid ruminations; may be classified as minimal risk based on the severity of the depressive symptoms and None.   Homework: Focus on things for enjoyment.   Plan: Follow up with Carey BullocksHeather Kentley Cedillo, LCSW at [facility] in one week or earlier if needed.

## 2017-12-05 ENCOUNTER — Telehealth: Payer: Self-pay | Admitting: Adult Health Nurse Practitioner

## 2017-12-05 ENCOUNTER — Ambulatory Visit: Payer: Self-pay | Admitting: Licensed Clinical Social Worker

## 2017-12-05 NOTE — Telephone Encounter (Signed)
Patient called to cancel °

## 2017-12-14 ENCOUNTER — Ambulatory Visit: Payer: Self-pay | Admitting: Ophthalmology

## 2017-12-30 ENCOUNTER — Other Ambulatory Visit: Payer: Self-pay | Admitting: Ophthalmology

## 2017-12-30 LAB — HM DIABETES EYE EXAM

## 2018-02-08 ENCOUNTER — Other Ambulatory Visit: Payer: Medicaid Other

## 2018-02-08 DIAGNOSIS — E119 Type 2 diabetes mellitus without complications: Secondary | ICD-10-CM

## 2018-02-09 LAB — MICROALBUMIN / CREATININE URINE RATIO
CREATININE, UR: 53.7 mg/dL
Microalb/Creat Ratio: 55.9 mg/g creat — ABNORMAL HIGH (ref 0.0–30.0)
Microalbumin, Urine: 30 ug/mL

## 2018-02-09 LAB — COMPREHENSIVE METABOLIC PANEL
A/G RATIO: 1.4 (ref 1.2–2.2)
ALK PHOS: 85 IU/L (ref 39–117)
ALT: 9 IU/L (ref 0–32)
AST: 11 IU/L (ref 0–40)
Albumin: 4 g/dL (ref 3.6–4.8)
BUN/Creatinine Ratio: 21 (ref 12–28)
BUN: 21 mg/dL (ref 8–27)
CHLORIDE: 99 mmol/L (ref 96–106)
CO2: 26 mmol/L (ref 20–29)
Calcium: 9.6 mg/dL (ref 8.7–10.3)
Creatinine, Ser: 0.98 mg/dL (ref 0.57–1.00)
GFR calc non Af Amer: 63 mL/min/{1.73_m2} (ref >59)
GFR, EST AFRICAN AMERICAN: 73 mL/min/{1.73_m2} (ref >59)
Globulin, Total: 2.8 g/dL (ref 1.5–4.5)
Glucose: 214 mg/dL — ABNORMAL HIGH (ref 65–99)
POTASSIUM: 3.8 mmol/L (ref 3.5–5.2)
Sodium: 141 mmol/L (ref 134–144)
TOTAL PROTEIN: 6.8 g/dL (ref 6.0–8.5)

## 2018-02-09 LAB — HEMOGLOBIN A1C
Est. average glucose Bld gHb Est-mCnc: 249 mg/dL
Hgb A1c MFr Bld: 10.3 % — ABNORMAL HIGH (ref 4.8–5.6)

## 2018-02-15 ENCOUNTER — Ambulatory Visit: Payer: Medicaid Other | Admitting: Internal Medicine

## 2018-02-15 ENCOUNTER — Ambulatory Visit: Payer: Self-pay

## 2018-02-15 VITALS — BP 180/95 | HR 76 | Temp 98.4°F | Ht 64.0 in | Wt 222.3 lb

## 2018-02-15 DIAGNOSIS — I10 Essential (primary) hypertension: Secondary | ICD-10-CM

## 2018-02-15 DIAGNOSIS — E119 Type 2 diabetes mellitus without complications: Secondary | ICD-10-CM

## 2018-02-15 DIAGNOSIS — Z794 Long term (current) use of insulin: Secondary | ICD-10-CM

## 2018-02-15 NOTE — Patient Instructions (Addendum)
Blood pressure is high today, but was well controlled on last 2 visits.  Walk every day, and followup in 8 weeks for blood pressure recheck.  Did not change any meds today.  Diabetes:  HbA1c was better than in 11/2017, 10.3.  Range 100-200s on home checks, no hypoglycemia.  Increase levemir (takes 7pm) to 45 units.  Continue lisinopril for blood pressure/urine protein.  Screening:  Mammogram.

## 2018-02-15 NOTE — Progress Notes (Signed)
Patient: Lauren Mueller Female    DOB: 20-Dec-1955   62 y.o.   MRN: [id number] Visit Date: 02/15/2018  Today's Provider: Wynona Luna, MD   Chief Complaint  Patient presents with  . Follow-up    lab work follow-up   Subjective:    HPI  Here for lab FU (DM) Under a lot of stress, not sleeping well.  Goes to sleep but wakes at 2am, can't go back to sleep.  Feels sleepy during the day.  Not working right now.  Saw the arthritis dr, has a bone spur in her back, part of spinal deterioration process.  Also some wrist pain. Has not had a mammogram in 10 years and would like one.     No Known Allergies Previous Medications   ACETAMINOPHEN (TYLENOL) 500 MG TABLET    Take 500 mg by mouth every 6 (six) hours as needed.   AMLODIPINE (NORVASC) 10 MG TABLET    Take 1 tablet (10 mg total) by mouth daily.   ATORVASTATIN (LIPITOR) 20 MG TABLET    Take 1 tablet (20 mg total) by mouth daily.   BLOOD GLUCOSE METER KIT AND SUPPLIES KIT    Dispense based on patient and insurance preference. Use up to four times daily as directed. (FOR ICD-9 250.00, 250.01).   CARVEDILOL (COREG) 25 MG TABLET    Take 1 tablet (25 mg total) by mouth 2 (two) times daily with a meal.   HYDROCHLOROTHIAZIDE (HYDRODIURIL) 25 MG TABLET    Take 1 tablet (25 mg total) by mouth daily.   INSULIN DETEMIR (LEVEMIR) 100 UNIT/ML INJECTION    Inject 0.4 mLs (40 Units total) into the skin at bedtime.   INSULIN LISPRO (HUMALOG) 100 UNIT/ML INJECTION    Inject 0.06 mLs (6 Units total) into the skin 3 (three) times daily before meals.   LISINOPRIL (PRINIVIL,ZESTRIL) 40 MG TABLET    Take 1 tablet (40 mg total) by mouth daily.   METFORMIN (GLUCOPHAGE) 1000 MG TABLET    Take 1 tablet (1,000 mg total) by mouth 2 (two) times daily with a meal.   PIOGLITAZONE (ACTOS) 45 MG TABLET    Take 1 tablet (45 mg total) by mouth daily.    Review of Systems  All other systems reviewed and are negative.   Social History   Tobacco Use  . Smoking  status: Never Smoker  . Smokeless tobacco: Never Used  Substance Use Topics  . Alcohol use: No    Alcohol/week: 0.0 standard drinks    Frequency: Never   Objective:   BP (!) 180/95 (BP Location: Left Arm, Patient Position: Sitting)   Pulse 76   Temp 98.4 F (36.9 C) (Oral)   Ht _0  (1.626 m)   Wt 222 lb 4.8 oz (100.8 kg)   BMI 38.16 kg/m   Physical Exam  Constitutional: She is oriented to person, place, and time. No distress.  Alert, nicely groomed  HENT:  Head: Atraumatic.  Eyes:  Conjugate gaze, no eye redness/drainage  Neck: Neck supple.  Cardiovascular: Normal rate and regular rhythm.  Pulmonary/Chest: No respiratory distress. She has no wheezes. She has no rales.  Lungs clear, symmetric breath sounds  Abdominal: She exhibits no distension.  Musculoskeletal: Normal range of motion.  No leg swelling  Neurological: She is alert and oriented to person, place, and time.  Skin: Skin is warm and dry.  No cyanosis  Nursing note and vitals reviewed.       Assessment & Plan:  Blood pressure is high today, but was well controlled on last 2 visits.  Walk every day, and followup in 8 weeks for blood pressure recheck.  Did not change any bp meds today.  Diabetes:  HbA1c was better than in 11/2017, 10.3.  Range 100-200s on home checks, no hypoglycemia.  Increase levemir (takes 7pm) to 45 units.  Continue lisinopril for blood pressure/urine protein.  Screening:  Mammogram.      Wynona Luna, MD   [facility]

## 2018-03-07 ENCOUNTER — Telehealth: Payer: Self-pay | Admitting: Pharmacist

## 2018-03-07 NOTE — Telephone Encounter (Signed)
03/07/2018 1:55:12 PM - Levemir Vial refill  03/07/18 Taking [facility] Le[facility]mir Vial Inject 40 units daily at bedtime.Forde RadonAJ

## 2018-03-16 ENCOUNTER — Telehealth: Payer: Self-pay | Admitting: Pharmacist

## 2018-03-16 NOTE — Telephone Encounter (Signed)
03/16/2018 9:12:21 AM - Levemir Vial refill  03/16/18 Faxed Novo Nordisk refill request for [employer] AutoLevemir Vial Inject 40 units daily at bedtime # 5.Forde RadonAJ

## 2018-03-26 ENCOUNTER — Other Ambulatory Visit: Payer: Self-pay | Admitting: Adult Health Nurse Practitioner

## 2018-03-26 DIAGNOSIS — E119 Type 2 diabetes mellitus without complications: Secondary | ICD-10-CM

## 2018-04-12 ENCOUNTER — Ambulatory Visit: Payer: Medicaid Other

## 2018-04-16 ENCOUNTER — Other Ambulatory Visit: Payer: Self-pay | Admitting: Adult Health Nurse Practitioner

## 2018-04-16 DIAGNOSIS — E119 Type 2 diabetes mellitus without complications: Secondary | ICD-10-CM

## 2018-05-17 ENCOUNTER — Other Ambulatory Visit: Payer: Self-pay | Admitting: Family

## 2018-05-17 DIAGNOSIS — Z1231 Encounter for screening mammogram for malignant neoplasm of breast: Secondary | ICD-10-CM

## 2018-05-29 ENCOUNTER — Ambulatory Visit
Admission: RE | Admit: 2018-05-29 | Discharge: 2018-05-29 | Disposition: A | Discharge status: Home / Self Care | Payer: BLUE CROSS/BLUE SHIELD | Source: Ambulatory Visit | Attending: Family | Admitting: Family

## 2018-05-29 DIAGNOSIS — Z1231 Encounter for screening mammogram for malignant neoplasm of breast: Secondary | ICD-10-CM | POA: Insufficient documentation

## 2018-06-04 ENCOUNTER — Ambulatory Visit: Payer: Self-pay

## 2018-06-06 ENCOUNTER — Telehealth: Payer: Self-pay | Admitting: Pharmacy Technician

## 2018-06-06 ENCOUNTER — Telehealth: Payer: Self-pay | Admitting: Pharmacist

## 2018-06-06 NOTE — Telephone Encounter (Signed)
[facility] showing that patient has pharmacy coverage with [employer].  Patient no longer meets [facility]'s eligibility criteria.  Patient notified.  Sherilyn Dacosta Care Manager [facility]

## 2019-03-14 IMAGING — MG DIGITAL SCREENING BILATERAL MAMMOGRAM WITH TOMO AND CAD
6 of 12 series · 6 of 36 positions shown · non-contrast
Comparison: Previous exam(s).

CLINICAL DATA: Screening.

EXAM:
DIGITAL SCREENING BILATERAL MAMMOGRAM WITH TOMO AND CAD

[R CC synth-2D]
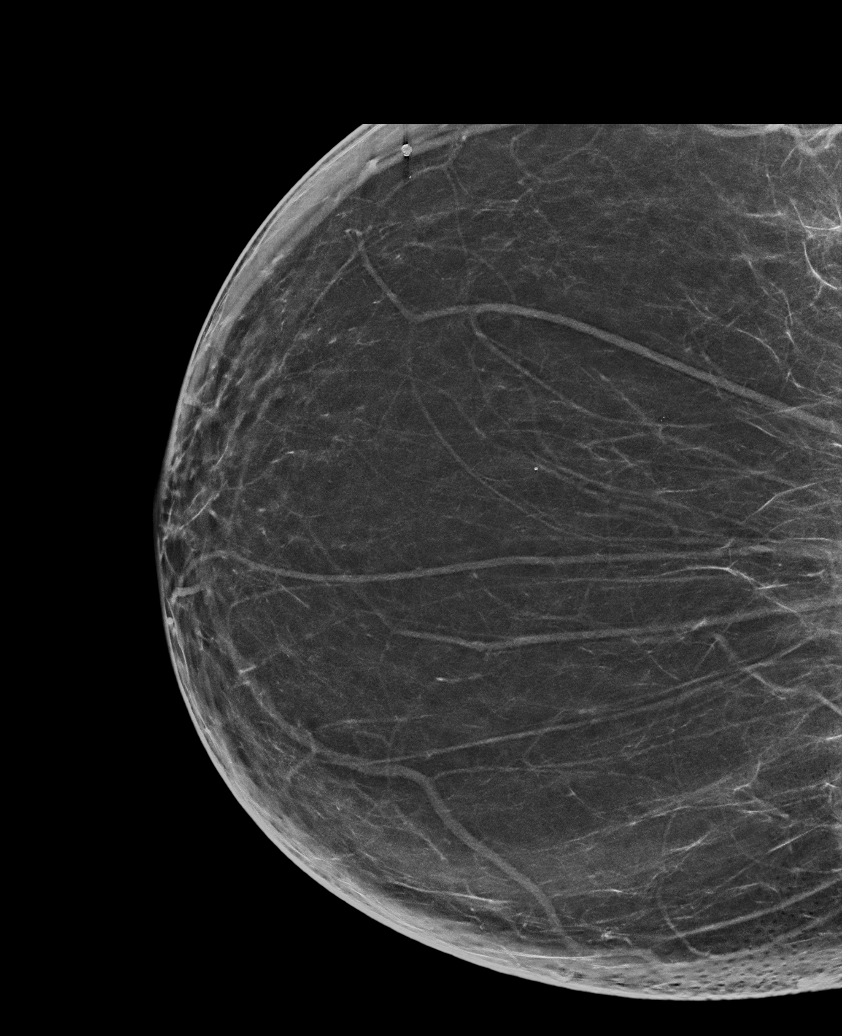

[L CC synth-2D]
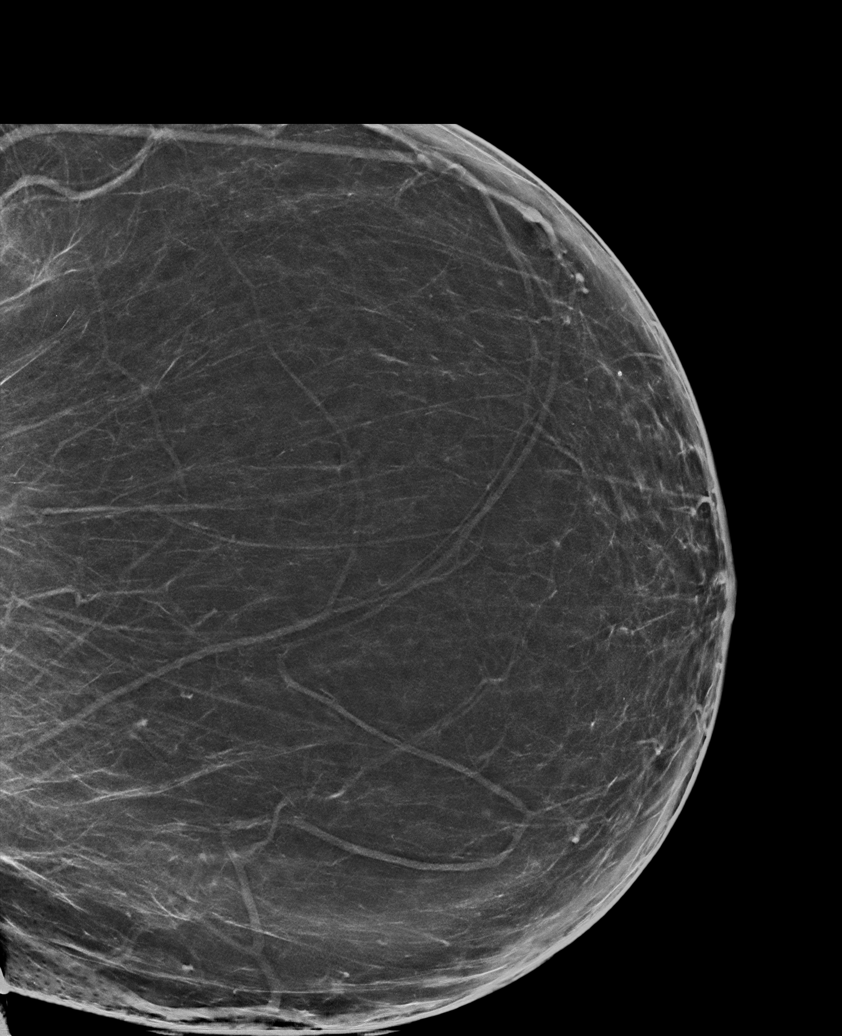

[R MLO synth-2D (1 of 2)]
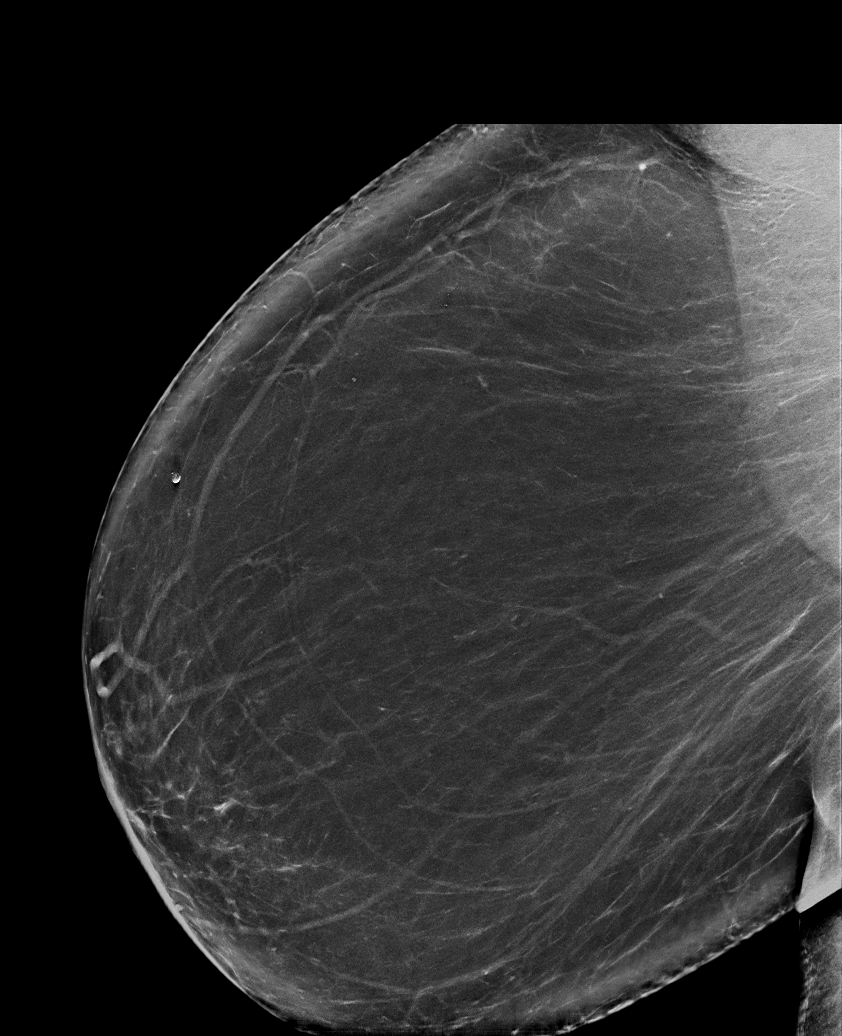

[R MLO synth-2D (2 of 2)]
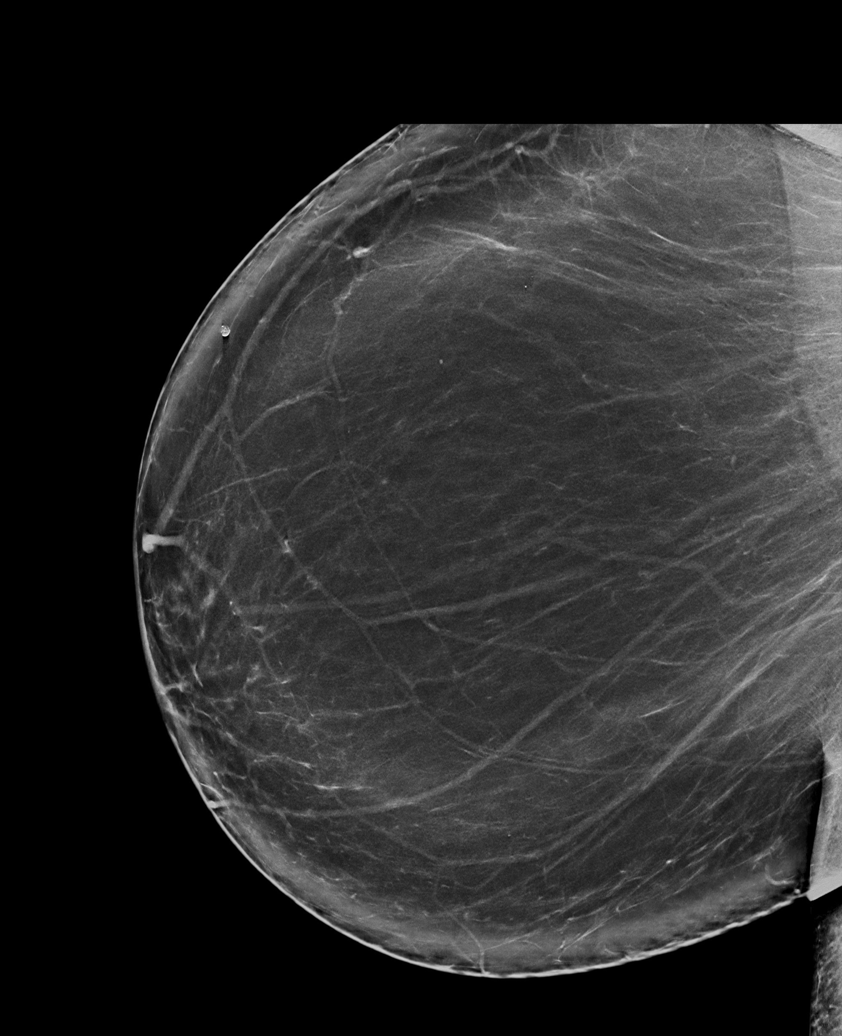

[L MLO synth-2D (1 of 2)]
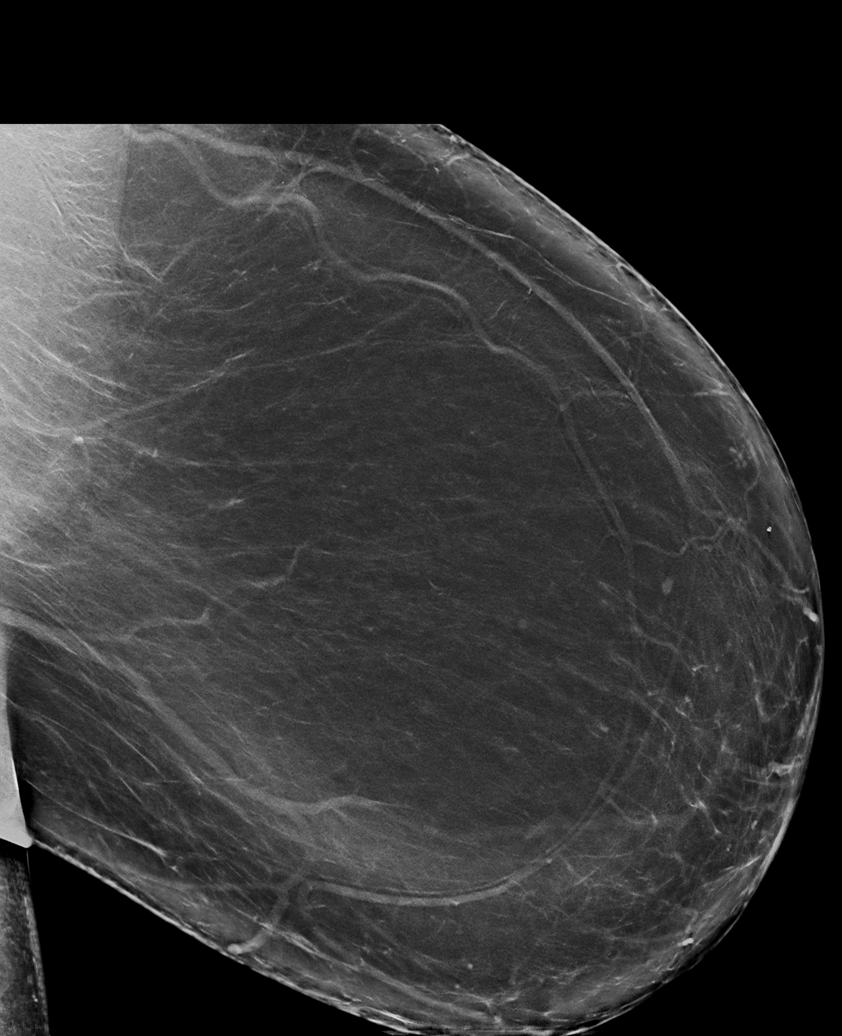

[L MLO synth-2D (2 of 2)]
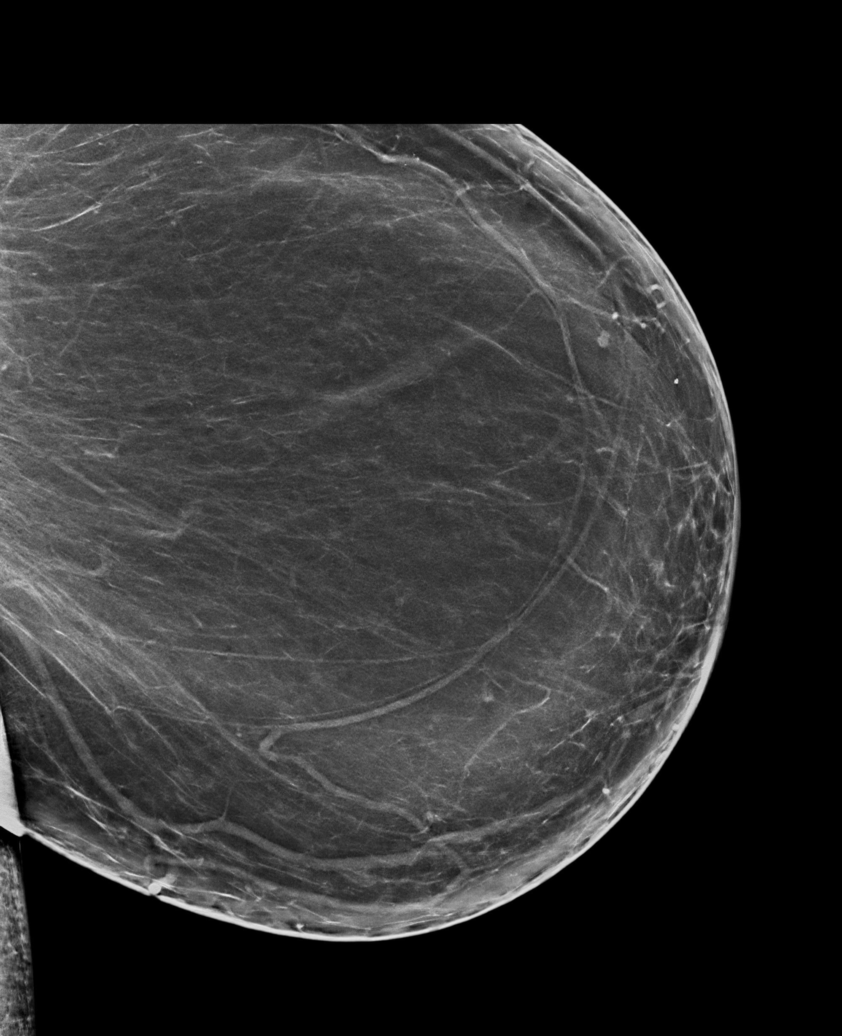

[6 of 36 positions shown; findings below may reference images not displayed]

ACR Breast Density Category b: There are scattered areas of
fibroglandular density.
FINDINGS: There are no findings suspicious for malignancy. Images were
processed with CAD.
IMPRESSION: No mammographic evidence of malignancy. A result letter of this
screening mammogram will be mailed directly to the patient.

RECOMMENDATION:
Screening mammogram in one year. (Code:CN-U-775)

BI-RADS CATEGORY  1: Negative.

## 2019-04-26 ENCOUNTER — Other Ambulatory Visit: Payer: Self-pay | Admitting: Internal Medicine

## 2019-04-26 DIAGNOSIS — Z1231 Encounter for screening mammogram for malignant neoplasm of breast: Secondary | ICD-10-CM

## 2019-06-07 ENCOUNTER — Ambulatory Visit
Admission: RE | Admit: 2019-06-07 | Discharge: 2019-06-07 | Disposition: A | Discharge status: Home / Self Care | Payer: BLUE CROSS/BLUE SHIELD | Source: Ambulatory Visit | Attending: Internal Medicine | Admitting: Internal Medicine

## 2019-06-07 ENCOUNTER — Other Ambulatory Visit: Payer: Self-pay

## 2019-06-07 DIAGNOSIS — Z1231 Encounter for screening mammogram for malignant neoplasm of breast: Secondary | ICD-10-CM | POA: Diagnosis present

## 2020-05-07 ENCOUNTER — Other Ambulatory Visit: Payer: Self-pay | Admitting: Internal Medicine

## 2020-05-07 DIAGNOSIS — Z1231 Encounter for screening mammogram for malignant neoplasm of breast: Secondary | ICD-10-CM

## 2020-08-26 ENCOUNTER — Ambulatory Visit
Admission: RE | Admit: 2020-08-26 | Discharge: 2020-08-26 | Disposition: A | Discharge status: Home / Self Care | Payer: Commercial Managed Care - HMO | Source: Ambulatory Visit | Attending: Internal Medicine | Admitting: Internal Medicine

## 2020-08-26 ENCOUNTER — Other Ambulatory Visit: Payer: Self-pay

## 2020-08-26 DIAGNOSIS — Z1231 Encounter for screening mammogram for malignant neoplasm of breast: Secondary | ICD-10-CM | POA: Diagnosis present

## 2022-04-15 DIAGNOSIS — M109 Gout, unspecified: Secondary | ICD-10-CM | POA: Diagnosis not present

## 2022-04-15 DIAGNOSIS — I10 Essential (primary) hypertension: Secondary | ICD-10-CM | POA: Diagnosis not present

## 2022-04-15 DIAGNOSIS — D518 Other vitamin B12 deficiency anemias: Secondary | ICD-10-CM | POA: Diagnosis not present

## 2022-04-15 DIAGNOSIS — E559 Vitamin D deficiency, unspecified: Secondary | ICD-10-CM | POA: Diagnosis not present

## 2022-04-15 DIAGNOSIS — E1165 Type 2 diabetes mellitus with hyperglycemia: Secondary | ICD-10-CM | POA: Diagnosis not present

## 2022-04-15 DIAGNOSIS — E782 Mixed hyperlipidemia: Secondary | ICD-10-CM | POA: Diagnosis not present

## 2022-04-15 DIAGNOSIS — R5383 Other fatigue: Secondary | ICD-10-CM | POA: Diagnosis not present

## 2022-04-15 DIAGNOSIS — E119 Type 2 diabetes mellitus without complications: Secondary | ICD-10-CM | POA: Diagnosis not present

## 2022-04-18 DIAGNOSIS — E782 Mixed hyperlipidemia: Secondary | ICD-10-CM | POA: Diagnosis not present

## 2022-04-18 DIAGNOSIS — I10 Essential (primary) hypertension: Secondary | ICD-10-CM | POA: Diagnosis not present

## 2022-04-18 DIAGNOSIS — R5383 Other fatigue: Secondary | ICD-10-CM | POA: Diagnosis not present

## 2022-04-18 DIAGNOSIS — E559 Vitamin D deficiency, unspecified: Secondary | ICD-10-CM | POA: Diagnosis not present

## 2022-04-18 DIAGNOSIS — E119 Type 2 diabetes mellitus without complications: Secondary | ICD-10-CM | POA: Diagnosis not present

## 2022-04-26 ENCOUNTER — Other Ambulatory Visit: Payer: Self-pay | Admitting: Internal Medicine

## 2022-04-26 DIAGNOSIS — Z1231 Encounter for screening mammogram for malignant neoplasm of breast: Secondary | ICD-10-CM

## 2022-04-26 DIAGNOSIS — Z112 Encounter for screening for other bacterial diseases: Secondary | ICD-10-CM | POA: Diagnosis not present

## 2022-04-26 DIAGNOSIS — R059 Cough, unspecified: Secondary | ICD-10-CM | POA: Diagnosis not present

## 2022-04-26 DIAGNOSIS — J209 Acute bronchitis, unspecified: Secondary | ICD-10-CM | POA: Diagnosis not present

## 2022-04-26 DIAGNOSIS — R058 Other specified cough: Secondary | ICD-10-CM | POA: Diagnosis not present

## 2022-04-26 DIAGNOSIS — Z0001 Encounter for general adult medical examination with abnormal findings: Secondary | ICD-10-CM | POA: Diagnosis not present

## 2022-04-26 DIAGNOSIS — E782 Mixed hyperlipidemia: Secondary | ICD-10-CM | POA: Diagnosis not present

## 2022-04-26 DIAGNOSIS — R918 Other nonspecific abnormal finding of lung field: Secondary | ICD-10-CM | POA: Diagnosis not present

## 2022-04-26 DIAGNOSIS — M109 Gout, unspecified: Secondary | ICD-10-CM | POA: Diagnosis not present

## 2022-04-26 DIAGNOSIS — I10 Essential (primary) hypertension: Secondary | ICD-10-CM | POA: Diagnosis not present

## 2022-04-26 DIAGNOSIS — E1165 Type 2 diabetes mellitus with hyperglycemia: Secondary | ICD-10-CM | POA: Diagnosis not present

## 2022-05-07 DIAGNOSIS — Z1211 Encounter for screening for malignant neoplasm of colon: Secondary | ICD-10-CM | POA: Diagnosis not present

## 2022-05-07 DIAGNOSIS — Z1212 Encounter for screening for malignant neoplasm of rectum: Secondary | ICD-10-CM | POA: Diagnosis not present

## 2022-05-10 ENCOUNTER — Ambulatory Visit (INDEPENDENT_AMBULATORY_CARE_PROVIDER_SITE_OTHER): Payer: Medicare Other | Admitting: Internal Medicine

## 2022-05-10 ENCOUNTER — Encounter: Payer: Self-pay | Admitting: Internal Medicine

## 2022-05-10 ENCOUNTER — Other Ambulatory Visit: Payer: Self-pay

## 2022-05-10 VITALS — BP 122/70 | HR 77 | Ht 64.0 in | Wt 201.6 lb

## 2022-05-10 DIAGNOSIS — Z794 Long term (current) use of insulin: Secondary | ICD-10-CM | POA: Diagnosis not present

## 2022-05-10 DIAGNOSIS — G252 Other specified forms of tremor: Secondary | ICD-10-CM | POA: Diagnosis not present

## 2022-05-10 DIAGNOSIS — E1165 Type 2 diabetes mellitus with hyperglycemia: Secondary | ICD-10-CM

## 2022-05-10 DIAGNOSIS — I10 Essential (primary) hypertension: Secondary | ICD-10-CM

## 2022-05-10 DIAGNOSIS — E1169 Type 2 diabetes mellitus with other specified complication: Secondary | ICD-10-CM

## 2022-05-10 DIAGNOSIS — E119 Type 2 diabetes mellitus without complications: Secondary | ICD-10-CM | POA: Diagnosis not present

## 2022-05-10 DIAGNOSIS — M1000 Idiopathic gout, unspecified site: Secondary | ICD-10-CM | POA: Diagnosis not present

## 2022-05-10 DIAGNOSIS — E785 Hyperlipidemia, unspecified: Secondary | ICD-10-CM | POA: Diagnosis not present

## 2022-05-10 DIAGNOSIS — G4733 Obstructive sleep apnea (adult) (pediatric): Secondary | ICD-10-CM

## 2022-05-10 LAB — GLUCOSE, POCT (MANUAL RESULT ENTRY): POC Glucose: 243 mg/dl — AB (ref 70–99)

## 2022-05-10 NOTE — Progress Notes (Addendum)
Subjective:  Follow up for Diabetes Management.    Patient ID: Lauren Mueller, female    DOB: 09/28/1955, 67 y.o.   MRN: [id number]  HPI Patient comes in today to discuss her blood sugar levels at home.  Patient has been recently started on insulin mix 70/30 at 30 units twice a day, with sliding scale Humalog coverage at lunchtime.  Before this patient was using basal insulin with very poorly controlled diabetes and showing very high hemoglobin A1c throughout this time.  However today she reports that her sugars at home are running much better than before with fastings in the range of 110s to 120 .  But this morning her fingerstick is 243, patient admits that she has been very dizzy since she woke up and she has not taken her morning dose of 70/30 insulin mix.  Generally she feels quite well however reports that she has this intentional tremor in her right hand which is getting worse.  Previously she has seen neurologist at [facility] clinic and she was given some medication which she decided to stop, this happened several years ago.  Today she agrees to go see them again and discuss further about management of her tremors.    Review of Systems  Constitutional: Negative.   HENT: Negative.    Eyes: Negative.   Respiratory: Negative.    Cardiovascular: Negative.   Gastrointestinal: Negative.   Endocrine: Negative.   Genitourinary: Negative.   Musculoskeletal: Negative.   Allergic/Immunologic: Negative.   Neurological:  Positive for tremors. Negative for dizziness, seizures, syncope, facial asymmetry, speech difficulty, light-headedness, numbness and headaches.  Psychiatric/Behavioral: Negative.         Objective:   Physical Exam Constitutional:      Appearance: Normal appearance.  HENT:     Head: Normocephalic and atraumatic.  Cardiovascular:     Rate and Rhythm: Normal rate and regular rhythm.  Pulmonary:     Effort: Pulmonary effort is normal.  Abdominal:     General: Abdomen is  flat. Bowel sounds are normal.     Palpations: Abdomen is soft.  Musculoskeletal:     Cervical back: Normal range of motion.  Skin:    General: Skin is warm and dry.  Neurological:     General: No focal deficit present.     Mental Status: She is alert.     Cranial Nerves: No cranial nerve deficit.     Sensory: No sensory deficit.     Motor: No weakness.     Coordination: Coordination normal.     Gait: Gait normal.  Psychiatric:        Mood and Affect: Mood normal.    Family History  Problem Relation Age of Onset   Hypertension Mother    Hypertension Sister    Breast cancer Neg Hx     Past Surgical History:  Procedure Laterality Date   ABDOMINAL HYSTERECTOMY     CESAREAN SECTION     x3   HERNIA REPAIR           Assessment & Plan:  Lauren Mueller was seen today for follow-up and diabetes.  Diagnoses and all orders for this visit:  Type 2 diabetes mellitus without complication, with long-term current use of insulin (HCC) -     POCT glucose (manual entry)  Coarse tremors -     Ambulatory referral to Neurology  Primary hypertension  Hyperlipidemia associated with type 2 diabetes mellitus (HCC)  OSA (obstructive sleep apnea)  Acute idiopathic gout, unspecified site  Poorly controlled diabetes mellitus ([place])   Patient Advised to increase her diet control. Increase Novolin 70/30 to 35 units q 12 hrs- and continue to monitor.Will send referral to Neurology for tremors.   Return in about 4 weeks (around 06/07/2022).   Perrin Maltese, MD

## 2022-05-13 ENCOUNTER — Encounter: Payer: Self-pay | Admitting: Internal Medicine

## 2022-05-16 LAB — COLOGUARD: COLOGUARD: NEGATIVE

## 2022-05-17 ENCOUNTER — Ambulatory Visit
Admission: RE | Admit: 2022-05-17 | Discharge: 2022-05-17 | Disposition: A | Discharge status: Home / Self Care | Payer: 59 | Source: Ambulatory Visit | Attending: Internal Medicine | Admitting: Internal Medicine

## 2022-05-17 DIAGNOSIS — Z1231 Encounter for screening mammogram for malignant neoplasm of breast: Secondary | ICD-10-CM | POA: Insufficient documentation

## 2022-05-26 ENCOUNTER — Other Ambulatory Visit: Payer: Self-pay | Admitting: Internal Medicine

## 2022-05-26 DIAGNOSIS — E1165 Type 2 diabetes mellitus with hyperglycemia: Secondary | ICD-10-CM

## 2022-06-07 ENCOUNTER — Ambulatory Visit (INDEPENDENT_AMBULATORY_CARE_PROVIDER_SITE_OTHER): Payer: 59 | Admitting: Internal Medicine

## 2022-06-07 ENCOUNTER — Encounter: Payer: Self-pay | Admitting: Internal Medicine

## 2022-06-07 VITALS — BP 132/70 | HR 71 | Ht 63.0 in | Wt 201.0 lb

## 2022-06-07 DIAGNOSIS — Z794 Long term (current) use of insulin: Secondary | ICD-10-CM | POA: Diagnosis not present

## 2022-06-07 DIAGNOSIS — G5603 Carpal tunnel syndrome, bilateral upper limbs: Secondary | ICD-10-CM

## 2022-06-07 DIAGNOSIS — M1A09X0 Idiopathic chronic gout, multiple sites, without tophus (tophi): Secondary | ICD-10-CM | POA: Diagnosis not present

## 2022-06-07 DIAGNOSIS — E119 Type 2 diabetes mellitus without complications: Secondary | ICD-10-CM | POA: Diagnosis not present

## 2022-06-07 DIAGNOSIS — I10 Essential (primary) hypertension: Secondary | ICD-10-CM | POA: Diagnosis not present

## 2022-06-07 DIAGNOSIS — E782 Mixed hyperlipidemia: Secondary | ICD-10-CM

## 2022-06-07 LAB — POCT CBG (FASTING - GLUCOSE)-MANUAL ENTRY: Glucose Fasting, POC: 165 mg/dL — AB (ref 70–99)

## 2022-06-07 NOTE — Progress Notes (Signed)
Established Patient Office Visit  Subjective:  Patient ID: Lauren Mueller, female    DOB: June 07, 1955  Age: 67 y.o. MRN: [id number]  Chief Complaint  Patient presents with   Follow-up    4 week follow up    Patient comes in for her follow-up today.  She is doing much better.  Fasting blood sugars are lower than before.  Patient is taking all her medications as prescribed. She is waiting to get a neurology consult for her nerve conduction studies for both wrists.     Past Medical History:  Diagnosis Date   Anxiety    Arthritis    Depression    Diabetes mellitus, type 2 ([place]) 01/09/2014   DKA, type 2 ([place]) 09/14/2016   Hyperlipidemia associated with type 2 diabetes mellitus ([place])    Hypertension associated with diabetes ([place])    Type 2 diabetes mellitus treated with insulin ([place])     Past Surgical History:  Procedure Laterality Date   ABDOMINAL HYSTERECTOMY     CESAREAN SECTION     x3   HERNIA REPAIR      Social History   Socioeconomic History   Marital status: Married    Spouse name: Not on file   Number of children: 5   Years of education: 13   Highest education level: Some college, no degree  Occupational History   Occupation: unemployed  Tobacco Use   Smoking status: Never   Smokeless tobacco: Never  Vaping Use   Vaping Use: Never used  Substance and Sexual Activity   Alcohol use: No    Alcohol/week: 0.0 standard drinks of alcohol   Drug use: No   Sexual activity: Not on file  Other Topics Concern   Not on file  Social History Narrative   PHQ 9 administered to patient score of 12. At this time medication is not recommended and follow up scheduled with Julian Hy LCSW scheduled on January 3rd at 10:30 am.       GAD 7 administered to the patient score of 11. Follow up for therapy in two weeks.       Social Determinants tool administered with the patient: finances are a strain. Patient has applied for disability and asked about SSI. Follow up in two  weeks, resources for SSI check list to be provided along with options for the application process.    Social Determinants of Health   Financial Resource Strain: High Risk (03/23/2017)   Overall Financial Resource Strain (CARDIA)    Difficulty of Paying Living Expenses: Hard  Food Insecurity: No Food Insecurity (03/23/2017)   Hunger Vital Sign    Worried About Running Out of Food in the Last Year: Never true    Ran Out of Food in the Last Year: Never true  Transportation Needs: No Transportation Needs (03/23/2017)   PRAPARE - Hydrologist (Medical): No    Lack of Transportation (Non-Medical): No  Physical Activity: Sufficiently Active (03/23/2017)   Exercise Vital Sign    Days of Exercise per Week: 7 days    Minutes of Exercise per Session: 40 min  Stress: Stress Concern Present (03/23/2017)   [place]    Feeling of Stress : Very much  Social Connections: Moderately Integrated (03/23/2017)   Social Connection and Isolation Panel [NHANES]    Frequency of Communication with Friends and Family: Never    Frequency of Social Gatherings with Friends and Family: Once a  week    Attends Religious Services: More than 4 times per year    Active Member of Clubs or Organizations: Yes    Attends Archivist Meetings: More than 4 times per year    Marital Status: Married  Human resources officer Violence: Not At Risk (03/23/2017)   Humiliation, Afraid, Rape, and Kick questionnaire    Fear of Current or Ex-Partner: No    Emotionally Abused: No    Physically Abused: No    Sexually Abused: No    Family History  Problem Relation Age of Onset   Hypertension Mother    Hypertension Sister    Breast cancer Neg Hx     No Known Allergies  Review of Systems  Constitutional: Negative.   HENT: Negative.    Eyes: Negative.   Respiratory: Negative.    Cardiovascular: Negative.   Genitourinary:  Negative.   Musculoskeletal: Negative.   Neurological:  Positive for tingling (both wrists and hands).  Psychiatric/Behavioral: Negative.         Objective:   BP 132/70   Pulse 71   Ht '5\' 3"'$  (1.6 m)   Wt 201 lb (91.2 kg)   SpO2 97%   BMI 35.61 kg/m   Vitals:   06/07/22 1000  BP: 132/70  Pulse: 71  Height: '5\' 3"'$  (1.6 m)  Weight: 201 lb (91.2 kg)  SpO2: 97%  BMI (Calculated): 35.61    Physical Exam Vitals and nursing note reviewed.  Constitutional:      Appearance: Normal appearance.  Cardiovascular:     Rate and Rhythm: Normal rate and regular rhythm.     Pulses: Normal pulses.  Pulmonary:     Effort: Pulmonary effort is normal.     Breath sounds: Normal breath sounds.  Abdominal:     General: Abdomen is flat. Bowel sounds are normal.     Palpations: Abdomen is soft.  Musculoskeletal:        General: Normal range of motion.     Cervical back: Normal range of motion and neck supple.  Neurological:     General: No focal deficit present.     Mental Status: She is alert and oriented to person, place, and time.      Results for orders placed or performed in visit on 06/07/22  POCT CBG (Fasting - Glucose)  Result Value Ref Range   Glucose Fasting, POC 165 (A) 70 - 99 mg/dL    Recent Results (from the past 2160 hour(s))  POCT glucose (manual entry)     Status: Abnormal   Collection Time: 05/10/22  9:27 AM  Result Value Ref Range   POC Glucose 243 (A) 70 - 99 mg/dl  POCT CBG (Fasting - Glucose)     Status: Abnormal   Collection Time: 06/07/22 10:03 AM  Result Value Ref Range   Glucose Fasting, POC 165 (A) 70 - 99 mg/dL      Assessment & Plan:  Patient advised to continue all her medications as such.  She will continue a strict diet control and increase her physical activity.  Neurology consult is being sent for her bilateral carpal tunnel nerve conduction study.  Patient will return for fasting blood work a couple couple of days before her next  appointment. Problem List Items Addressed This Visit     Type 2 diabetes mellitus ([place]) - Primary   Relevant Orders   POCT CBG (Fasting - Glucose) (Completed)   Hemoglobin A1c   Other Visit Diagnoses     Bilateral carpal  tunnel syndrome       Relevant Orders   Ambulatory referral to Neurology   Chronic gout of multiple sites, unspecified cause       Relevant Orders   Uric acid   Mixed hyperlipidemia       Relevant Orders   Lipid Panel w/o Chol/HDL Ratio   Essential hypertension, benign       Relevant Orders   CMP14+EGFR       Return in about 6 weeks (around 07/19/2022).   Total time spent: 30 minutes  Perrin Maltese, MD  06/07/2022

## 2022-07-04 ENCOUNTER — Telehealth: Payer: Self-pay

## 2022-07-04 NOTE — Addendum Note (Signed)
Addended by: Georgian Co on: 07/04/2022 03:03 PM   Modules accepted: Orders

## 2022-07-04 NOTE — Telephone Encounter (Signed)
Pt called and left vm regarding rx novolog not being covered by insurance, she is almost out of insulin & asked if we can send alternate rx? Please advise

## 2022-07-05 ENCOUNTER — Other Ambulatory Visit: Payer: Self-pay

## 2022-07-05 DIAGNOSIS — E119 Type 2 diabetes mellitus without complications: Secondary | ICD-10-CM

## 2022-07-05 MED ORDER — INSULIN NPH ISOPHANE & REGULAR (70-30) 100 UNIT/ML ~~LOC~~ SUSP: SUBCUTANEOUS | 3 refills | Status: DC

## 2022-07-07 ENCOUNTER — Other Ambulatory Visit: Payer: Self-pay

## 2022-07-07 MED ORDER — INSULIN NPH ISOPHANE & REGULAR (70-30) 100 UNIT/ML ~~LOC~~ SUSP: 30.0000 [IU] | Freq: Two times a day (BID) | SUBCUTANEOUS | 3 refills | Status: DC

## 2022-07-12 ENCOUNTER — Other Ambulatory Visit: Payer: Self-pay

## 2022-07-12 MED ORDER — HUMULIN 70/30 KWIKPEN (70-30) 100 UNIT/ML ~~LOC~~ SUPN: 30.0000 [IU] | PEN_INJECTOR | Freq: Two times a day (BID) | SUBCUTANEOUS | 3 refills | Status: DC

## 2022-07-22 ENCOUNTER — Encounter: Payer: Self-pay | Admitting: Internal Medicine

## 2022-07-22 ENCOUNTER — Ambulatory Visit (INDEPENDENT_AMBULATORY_CARE_PROVIDER_SITE_OTHER): Payer: Medicare Other | Admitting: Internal Medicine

## 2022-07-22 ENCOUNTER — Other Ambulatory Visit: Payer: Self-pay

## 2022-07-22 VITALS — BP 130/78 | HR 69 | Ht 63.0 in | Wt 200.6 lb

## 2022-07-22 DIAGNOSIS — I10 Essential (primary) hypertension: Secondary | ICD-10-CM | POA: Diagnosis not present

## 2022-07-22 DIAGNOSIS — E1165 Type 2 diabetes mellitus with hyperglycemia: Secondary | ICD-10-CM

## 2022-07-22 DIAGNOSIS — E119 Type 2 diabetes mellitus without complications: Secondary | ICD-10-CM | POA: Diagnosis not present

## 2022-07-22 DIAGNOSIS — Z794 Long term (current) use of insulin: Secondary | ICD-10-CM | POA: Diagnosis not present

## 2022-07-22 DIAGNOSIS — E782 Mixed hyperlipidemia: Secondary | ICD-10-CM | POA: Diagnosis not present

## 2022-07-22 DIAGNOSIS — G4733 Obstructive sleep apnea (adult) (pediatric): Secondary | ICD-10-CM

## 2022-07-22 LAB — POCT CBG (FASTING - GLUCOSE)-MANUAL ENTRY: Glucose Fasting, POC: 219 mg/dL — AB (ref 70–99)

## 2022-07-22 MED ORDER — HUMULIN 70/30 KWIKPEN (70-30) 100 UNIT/ML ~~LOC~~ SUPN: 30.0000 [IU] | PEN_INJECTOR | Freq: Two times a day (BID) | SUBCUTANEOUS | 3 refills | Status: DC

## 2022-07-22 NOTE — Progress Notes (Signed)
Established Patient Office Visit  Subjective:  Patient ID: Lauren Mueller, female    DOB: 1956/02/17  Age: 67 y.o. MRN: [id number]  Chief Complaint  Patient presents with   Follow-up    6 week follow up    Patient comes in for her follow-up today for diabetes.  Reports that she has not taken her insulin 70/30 for last 3 weeks as she ran out and was told that it is not being covered by the insurance.  Upon contacting the pharmacy turns out that insulin was on backorder and thus why she did not get it we will need to send it to a different pharmacy.  Meanwhile patient had been trying to use her Basaglar from before. She offers no new complaints. Will check her blood work at next visit.    No other concerns at this time.   Past Medical History:  Diagnosis Date   Anxiety    Arthritis    Depression    Diabetes mellitus, type 2 01/09/2014   DKA, type 2 09/14/2016   Hyperlipidemia associated with type 2 diabetes mellitus    Hypertension associated with diabetes    Type 2 diabetes mellitus treated with insulin     Past Surgical History:  Procedure Laterality Date   ABDOMINAL HYSTERECTOMY     CESAREAN SECTION     x3   HERNIA REPAIR      Social History   Socioeconomic History   Marital status: Married    Spouse name: Not on file   Number of children: 5   Years of education: 13   Highest education level: Some college, no degree  Occupational History   Occupation: unemployed  Tobacco Use   Smoking status: Never   Smokeless tobacco: Never  Vaping Use   Vaping Use: Never used  Substance and Sexual Activity   Alcohol use: No    Alcohol/week: 0.0 standard drinks of alcohol   Drug use: No   Sexual activity: Not on file  Other Topics Concern   Not on file  Social History Narrative   PHQ 9 administered to patient score of 12. At this time medication is not recommended and follow up scheduled with Carey Bullocks LCSW scheduled on January 3rd at 10:30 am.       GAD 7  administered to the patient score of 11. Follow up for therapy in two weeks.       Social Determinants tool administered with the patient: finances are a strain. Patient has applied for disability and asked about SSI. Follow up in two weeks, resources for SSI check list to be provided along with options for the application process.    Social Determinants of Health   Financial Resource Strain: High Risk (03/23/2017)   Overall Financial Resource Strain (CARDIA)    Difficulty of Paying Living Expenses: Hard  Food Insecurity: No Food Insecurity (03/23/2017)   Hunger Vital Sign    Worried About Running Out of Food in the Last Year: Never true    Ran Out of Food in the Last Year: Never true  Transportation Needs: No Transportation Needs (03/23/2017)   PRAPARE - Administrator, Civil Service (Medical): No    Lack of Transportation (Non-Medical): No  Physical Activity: Sufficiently Active (03/23/2017)   Exercise Vital Sign    Days of Exercise per Week: 7 days    Minutes of Exercise per Session: 40 min  Stress: Stress Concern Present (03/23/2017)   [employer] of Occupational Health -  Occupational Stress Questionnaire    Feeling of Stress : Very much  Social Connections: Moderately Integrated (03/23/2017)   Social Connection and Isolation Panel [NHANES]    Frequency of Communication with Friends and Family: Never    Frequency of Social Gatherings with Friends and Family: Once a week    Attends Religious Services: More than 4 times per year    Active Member of [employer] or Organizations: Yes    Attends Engineer, structural: More than 4 times per year    Marital Status: Married  Catering manager Violence: Not At Risk (03/23/2017)   Humiliation, Afraid, Rape, and Kick questionnaire    Fear of Current or Ex-Partner: No    Emotionally Abused: No    Physically Abused: No    Sexually Abused: No    Family History  Problem Relation Age of Onset   Hypertension Mother     Hypertension Sister    Breast cancer Neg Hx     No Known Allergies  Review of Systems  Constitutional: Negative.   HENT:  Negative for congestion, ear discharge, ear pain, hearing loss, sinus pain and tinnitus.   Eyes: Negative.   Respiratory: Negative.    Cardiovascular:  Negative for chest pain, palpitations, leg swelling and PND.  Gastrointestinal:  Negative for blood in stool, constipation, diarrhea, heartburn, nausea and vomiting.  Genitourinary:  Negative for dysuria and hematuria.  Musculoskeletal: Negative.   Neurological:  Negative for dizziness, tingling, weakness and headaches.  Psychiatric/Behavioral: Negative.         Objective:   BP 130/78   Pulse 69   Ht 5\' 3"  (1.6 m)   Wt 200 lb 9.6 oz (91 kg)   SpO2 96%   BMI 35.53 kg/m   Vitals:   07/22/22 1023  BP: 130/78  Pulse: 69  Height: 5\' 3"  (1.6 m)  Weight: 200 lb 9.6 oz (91 kg)  SpO2: 96%  BMI (Calculated): 35.54    Physical Exam Vitals and nursing note reviewed.  Constitutional:      Appearance: Normal appearance.  Cardiovascular:     Rate and Rhythm: Normal rate and regular rhythm.     Pulses: Normal pulses.     Heart sounds: Normal heart sounds.  Pulmonary:     Effort: Pulmonary effort is normal.     Breath sounds: Normal breath sounds.  Abdominal:     General: Bowel sounds are normal.     Palpations: Abdomen is soft.  Musculoskeletal:        General: Normal range of motion.     Cervical back: Normal range of motion and neck supple.  Skin:    General: Skin is warm.  Neurological:     General: No focal deficit present.     Mental Status: She is alert and oriented to person, place, and time.      Results for orders placed or performed in visit on 07/22/22  POCT CBG (Fasting - Glucose)  Result Value Ref Range   Glucose Fasting, POC 219 (A) 70 - 99 mg/dL    Recent Results (from the past 2160 hour(s))  POCT glucose (manual entry)     Status: Abnormal   Collection Time: 05/10/22  9:27  AM  Result Value Ref Range   POC Glucose 243 (A) 70 - 99 mg/dl  POCT CBG (Fasting - Glucose)     Status: Abnormal   Collection Time: 06/07/22 10:03 AM  Result Value Ref Range   Glucose Fasting, POC 165 (A) 70 - 99  mg/dL  POCT CBG (Fasting - Glucose)     Status: Abnormal   Collection Time: 07/22/22 10:31 AM  Result Value Ref Range   Glucose Fasting, POC 219 (A) 70 - 99 mg/dL      Assessment & Plan:  Patient advised to pick up her Novolin 70/30 insulin and resume using it along with her sliding scale Humalog.  Patient will return in 2 weeks to further adjust medication. Problem List Items Addressed This Visit     Type 2 diabetes mellitus - Primary   Relevant Orders   POCT CBG (Fasting - Glucose) (Completed)   Essential hypertension, benign   Mixed hyperlipidemia   Poorly controlled diabetes mellitus   OSA (obstructive sleep apnea)    Return in about 2 weeks (around 08/05/2022).   Total time spent: 30 minutes  Margaretann Loveless, MD  07/22/2022

## 2022-08-05 ENCOUNTER — Other Ambulatory Visit: Payer: Self-pay | Admitting: Internal Medicine

## 2022-08-05 ENCOUNTER — Ambulatory Visit (INDEPENDENT_AMBULATORY_CARE_PROVIDER_SITE_OTHER): Payer: Medicare Other | Admitting: Internal Medicine

## 2022-08-05 ENCOUNTER — Other Ambulatory Visit: Payer: Self-pay

## 2022-08-05 ENCOUNTER — Encounter: Payer: Self-pay | Admitting: Internal Medicine

## 2022-08-05 VITALS — BP 128/70 | HR 70 | Ht 63.0 in | Wt 202.6 lb

## 2022-08-05 DIAGNOSIS — I10 Essential (primary) hypertension: Secondary | ICD-10-CM

## 2022-08-05 DIAGNOSIS — G4733 Obstructive sleep apnea (adult) (pediatric): Secondary | ICD-10-CM

## 2022-08-05 DIAGNOSIS — M109 Gout, unspecified: Secondary | ICD-10-CM | POA: Diagnosis not present

## 2022-08-05 DIAGNOSIS — E782 Mixed hyperlipidemia: Secondary | ICD-10-CM

## 2022-08-05 DIAGNOSIS — M1A09X0 Idiopathic chronic gout, multiple sites, without tophus (tophi): Secondary | ICD-10-CM

## 2022-08-05 DIAGNOSIS — E119 Type 2 diabetes mellitus without complications: Secondary | ICD-10-CM | POA: Diagnosis not present

## 2022-08-05 DIAGNOSIS — E1165 Type 2 diabetes mellitus with hyperglycemia: Secondary | ICD-10-CM

## 2022-08-05 DIAGNOSIS — Z794 Long term (current) use of insulin: Secondary | ICD-10-CM | POA: Diagnosis not present

## 2022-08-05 LAB — POCT CBG (FASTING - GLUCOSE)-MANUAL ENTRY: Glucose Fasting, POC: 160 mg/dL — AB (ref 70–99)

## 2022-08-05 NOTE — Progress Notes (Signed)
Established Patient Office Visit  Subjective:  Patient ID: Lauren Mueller, female    DOB: 1955/06/15  Age: 67 y.o. MRN: [id number]  Chief Complaint  Patient presents with   Follow-up    2 week follow up    Patient comes in for her follow-up today.  She was finally able to get her insulin mix 7030 and is now taking the recommended dose.  She is also getting sliding scale insulin with lunch and dinner.  Patient feels well except for being tired and overwhelmed due to her husband's health issues.  But she denies any chest pain, no shortness of breath, no nausea vomiting and no abdominal complaints. She is fasting for blood work.  She now has a date for her mammogram.  However she is still waiting to get her eye exam.    No other concerns at this time.   Past Medical History:  Diagnosis Date   Anxiety    Arthritis    Depression    Diabetes mellitus, type 2 (HCC) 01/09/2014   DKA, type 2 (HCC) 09/14/2016   Hyperlipidemia associated with type 2 diabetes mellitus (HCC)    Hypertension associated with diabetes (HCC)    Type 2 diabetes mellitus treated with insulin (HCC)     Past Surgical History:  Procedure Laterality Date   ABDOMINAL HYSTERECTOMY     CESAREAN SECTION     x3   HERNIA REPAIR      Social History   Socioeconomic History   Marital status: Married    Spouse name: Not on file   Number of children: 5   Years of education: 13   Highest education level: Some college, no degree  Occupational History   Occupation: unemployed  Tobacco Use   Smoking status: Never   Smokeless tobacco: Never  Vaping Use   Vaping Use: Never used  Substance and Sexual Activity   Alcohol use: No    Alcohol/week: 0.0 standard drinks of alcohol   Drug use: No   Sexual activity: Not on file  Other Topics Concern   Not on file  Social History Narrative   PHQ 9 administered to patient score of 12. At this time medication is not recommended and follow up scheduled with Carey Bullocks  LCSW scheduled on January 3rd at 10:30 am.       GAD 7 administered to the patient score of 11. Follow up for therapy in two weeks.       Social Determinants tool administered with the patient: finances are a strain. Patient has applied for disability and asked about SSI. Follow up in two weeks, resources for SSI check list to be provided along with options for the application process.    Social Determinants of Health   Financial Resource Strain: High Risk (03/23/2017)   Overall Financial Resource Strain (CARDIA)    Difficulty of Paying Living Expenses: Hard  Food Insecurity: No Food Insecurity (03/23/2017)   Hunger Vital Sign    Worried About Running Out of Food in the Last Year: Never true    Ran Out of Food in the Last Year: Never true  Transportation Needs: No Transportation Needs (03/23/2017)   PRAPARE - Administrator, Civil Service (Medical): No    Lack of Transportation (Non-Medical): No  Physical Activity: Sufficiently Active (03/23/2017)   Exercise Vital Sign    Days of Exercise per Week: 7 days    Minutes of Exercise per Session: 40 min  Stress: Stress Concern Present (03/23/2017)  [employer] of Occupational Health - Occupational Stress Questionnaire    Feeling of Stress : Very much  Social Connections: Moderately Integrated (03/23/2017)   Social Connection and Isolation Panel [NHANES]    Frequency of Communication with Friends and Family: Never    Frequency of Social Gatherings with Friends and Family: Once a week    Attends Religious Services: More than 4 times per year    Active Member of [employer] or Organizations: Yes    Attends Engineer, structural: More than 4 times per year    Marital Status: Married  Catering manager Violence: Not At Risk (03/23/2017)   Humiliation, Afraid, Rape, and Kick questionnaire    Fear of Current or Ex-Partner: No    Emotionally Abused: No    Physically Abused: No    Sexually Abused: No    Family History   Problem Relation Age of Onset   Hypertension Mother    Hypertension Sister    Breast cancer Neg Hx     No Known Allergies  Review of Systems  Constitutional: Negative.   HENT: Negative.    Eyes: Negative.   Respiratory:  Negative for cough, shortness of breath and wheezing.   Cardiovascular:  Negative for chest pain, palpitations and leg swelling.  Gastrointestinal:  Negative for abdominal pain, blood in stool, heartburn, nausea and vomiting.  Genitourinary: Negative.   Musculoskeletal:  Negative for joint pain and myalgias.  Skin: Negative.   Neurological: Negative.   Psychiatric/Behavioral:  Negative for depression and memory loss. The patient is nervous/anxious. The patient does not have insomnia.        Objective:   BP 128/70   Pulse 70   Ht 5\' 3"  (1.6 m)   Wt 202 lb 9.6 oz (91.9 kg)   SpO2 96%   BMI 35.89 kg/m   Vitals:   08/05/22 0903  BP: 128/70  Pulse: 70  Height: 5\' 3"  (1.6 m)  Weight: 202 lb 9.6 oz (91.9 kg)  SpO2: 96%  BMI (Calculated): 35.9    Physical Exam Vitals and nursing note reviewed.  Constitutional:      Appearance: She is obese.  Cardiovascular:     Rate and Rhythm: Normal rate and regular rhythm.     Pulses: Normal pulses.  Pulmonary:     Effort: Pulmonary effort is normal.  Abdominal:     Palpations: Abdomen is soft.  Musculoskeletal:        General: No swelling. Normal range of motion.     Right lower leg: No edema.     Left lower leg: No edema.  Neurological:     Mental Status: She is alert.  Psychiatric:        Mood and Affect: Mood normal.        Behavior: Behavior normal.      Results for orders placed or performed in visit on 08/05/22  POCT CBG (Fasting - Glucose)  Result Value Ref Range   Glucose Fasting, POC 160 (A) 70 - 99 mg/dL    Recent Results (from the past 2160 hour(s))  POCT glucose (manual entry)     Status: Abnormal   Collection Time: 05/10/22  9:27 AM  Result Value Ref Range   POC Glucose 243 (A)  70 - 99 mg/dl  POCT CBG (Fasting - Glucose)     Status: Abnormal   Collection Time: 06/07/22 10:03 AM  Result Value Ref Range   Glucose Fasting, POC 165 (A) 70 - 99 mg/dL  POCT CBG (Fasting -  Glucose)     Status: Abnormal   Collection Time: 07/22/22 10:31 AM  Result Value Ref Range   Glucose Fasting, POC 219 (A) 70 - 99 mg/dL  POCT CBG (Fasting - Glucose)     Status: Abnormal   Collection Time: 08/05/22  9:05 AM  Result Value Ref Range   Glucose Fasting, POC 160 (A) 70 - 99 mg/dL      Assessment & Plan:  Patient to get fasting labs today.  Continue the current regimen of the insulin but will adjust once lab results are available. Problem List Items Addressed This Visit     Type 2 diabetes mellitus (HCC) - Primary   Relevant Orders   POCT CBG (Fasting - Glucose) (Completed)   Essential hypertension, benign   Mixed hyperlipidemia   OSA (obstructive sleep apnea)    Return in about 3 months (around 11/05/2022).   Total time spent: 25 minutes  Margaretann Loveless, MD  08/05/2022

## 2022-08-06 LAB — HEMOGLOBIN A1C
Est. average glucose Bld gHb Est-mCnc: 275 mg/dL
Hgb A1c MFr Bld: 11.2 % — ABNORMAL HIGH (ref 4.8–5.6)

## 2022-08-06 LAB — CMP14+EGFR
ALT: 11 IU/L (ref 0–32)
AST: 12 IU/L (ref 0–40)
Albumin/Globulin Ratio: 1.7 (ref 1.2–2.2)
Albumin: 4.1 g/dL (ref 3.9–4.9)
Alkaline Phosphatase: 118 IU/L (ref 44–121)
BUN/Creatinine Ratio: 18 (ref 12–28)
BUN: 19 mg/dL (ref 8–27)
Bilirubin Total: 0.3 mg/dL (ref 0.0–1.2)
CO2: 26 mmol/L (ref 20–29)
Calcium: 9.7 mg/dL (ref 8.7–10.3)
Chloride: 104 mmol/L (ref 96–106)
Creatinine, Ser: 1.05 mg/dL — ABNORMAL HIGH (ref 0.57–1.00)
Globulin, Total: 2.4 g/dL (ref 1.5–4.5)
Glucose: 138 mg/dL — ABNORMAL HIGH (ref 70–99)
Potassium: 4.1 mmol/L (ref 3.5–5.2)
Sodium: 142 mmol/L (ref 134–144)
Total Protein: 6.5 g/dL (ref 6.0–8.5)
eGFR: 59 mL/min/{1.73_m2} — ABNORMAL LOW (ref >59)

## 2022-08-06 LAB — LIPID PANEL W/O CHOL/HDL RATIO
Cholesterol, Total: 147 mg/dL (ref 100–199)
HDL: 46 mg/dL (ref >39)
LDL Chol Calc (NIH): 82 mg/dL (ref 0–99)
Triglycerides: 103 mg/dL (ref 0–149)
VLDL Cholesterol Cal: 19 mg/dL (ref 5–40)

## 2022-08-06 LAB — URIC ACID
Uric Acid: 6 mg/dL (ref 3.0–7.2)
Uric Acid: 6 mg/dL (ref 3.0–7.2)

## 2022-08-09 ENCOUNTER — Telehealth: Payer: Self-pay | Admitting: Internal Medicine

## 2022-08-09 MED ORDER — INSULIN LISPRO 100 UNIT/ML IJ SOLN: INTRAMUSCULAR | 3 refills | Status: DC

## 2022-08-09 NOTE — Telephone Encounter (Signed)
Tristin at [facility] called and the patient's insurance covered the generic Novalog in February but will no longer cover it. Insurance now prefers Humalog not Novalog. Can you please send new Rx to [facility]?

## 2022-09-20 DIAGNOSIS — R29898 Other symptoms and signs involving the musculoskeletal system: Secondary | ICD-10-CM | POA: Diagnosis not present

## 2022-09-20 DIAGNOSIS — G250 Essential tremor: Secondary | ICD-10-CM | POA: Diagnosis not present

## 2022-09-20 DIAGNOSIS — G5603 Carpal tunnel syndrome, bilateral upper limbs: Secondary | ICD-10-CM | POA: Diagnosis not present

## 2022-09-20 DIAGNOSIS — R202 Paresthesia of skin: Secondary | ICD-10-CM | POA: Diagnosis not present

## 2022-09-20 DIAGNOSIS — R200 Anesthesia of skin: Secondary | ICD-10-CM | POA: Diagnosis not present

## 2022-09-20 DIAGNOSIS — R2689 Other abnormalities of gait and mobility: Secondary | ICD-10-CM | POA: Diagnosis not present

## 2022-10-08 ENCOUNTER — Other Ambulatory Visit: Payer: Self-pay | Admitting: Internal Medicine

## 2022-10-08 DIAGNOSIS — E1165 Type 2 diabetes mellitus with hyperglycemia: Secondary | ICD-10-CM

## 2022-10-17 DIAGNOSIS — G250 Essential tremor: Secondary | ICD-10-CM | POA: Diagnosis not present

## 2022-10-17 DIAGNOSIS — G5603 Carpal tunnel syndrome, bilateral upper limbs: Secondary | ICD-10-CM | POA: Diagnosis not present

## 2022-10-17 DIAGNOSIS — R200 Anesthesia of skin: Secondary | ICD-10-CM | POA: Diagnosis not present

## 2022-10-17 DIAGNOSIS — R29898 Other symptoms and signs involving the musculoskeletal system: Secondary | ICD-10-CM | POA: Diagnosis not present

## 2022-10-17 DIAGNOSIS — R202 Paresthesia of skin: Secondary | ICD-10-CM | POA: Diagnosis not present

## 2022-11-04 ENCOUNTER — Ambulatory Visit: Payer: Medicare Other | Admitting: Internal Medicine

## 2022-11-14 ENCOUNTER — Encounter: Payer: Self-pay | Admitting: Internal Medicine

## 2022-11-14 ENCOUNTER — Other Ambulatory Visit: Payer: Self-pay | Admitting: Internal Medicine

## 2022-11-14 ENCOUNTER — Ambulatory Visit (INDEPENDENT_AMBULATORY_CARE_PROVIDER_SITE_OTHER): Payer: Medicare Other | Admitting: Internal Medicine

## 2022-11-14 VITALS — BP 138/88 | HR 71 | Ht 64.0 in | Wt 205.2 lb

## 2022-11-14 DIAGNOSIS — E1165 Type 2 diabetes mellitus with hyperglycemia: Secondary | ICD-10-CM

## 2022-11-14 DIAGNOSIS — Z794 Long term (current) use of insulin: Secondary | ICD-10-CM | POA: Diagnosis not present

## 2022-11-14 DIAGNOSIS — I10 Essential (primary) hypertension: Secondary | ICD-10-CM | POA: Diagnosis not present

## 2022-11-14 DIAGNOSIS — E1159 Type 2 diabetes mellitus with other circulatory complications: Secondary | ICD-10-CM

## 2022-11-14 DIAGNOSIS — M1A09X0 Idiopathic chronic gout, multiple sites, without tophus (tophi): Secondary | ICD-10-CM | POA: Diagnosis not present

## 2022-11-14 DIAGNOSIS — E119 Type 2 diabetes mellitus without complications: Secondary | ICD-10-CM

## 2022-11-14 DIAGNOSIS — I152 Hypertension secondary to endocrine disorders: Secondary | ICD-10-CM

## 2022-11-14 DIAGNOSIS — E782 Mixed hyperlipidemia: Secondary | ICD-10-CM

## 2022-11-14 LAB — POCT CBG (FASTING - GLUCOSE)-MANUAL ENTRY: Glucose Fasting, POC: 220 mg/dL — AB (ref 70–99)

## 2022-11-14 MED ORDER — METFORMIN HCL 1000 MG PO TABS
1000.0000 mg | ORAL_TABLET | Freq: Two times a day (BID) | ORAL | 1 refills | Status: DC
Start: 2022-11-14 — End: 2023-11-27

## 2022-11-14 MED ORDER — BLOOD GLUCOSE MONITOR KIT: PACK | 0 refills | Status: AC

## 2022-11-14 MED ORDER — BLOOD GLUCOSE MONITOR KIT
PACK | 0 refills | Status: DC
Start: 2022-11-14 — End: 2022-11-14

## 2022-11-14 MED ORDER — HUMULIN 70/30 KWIKPEN (70-30) 100 UNIT/ML ~~LOC~~ SUPN
38.0000 [IU] | PEN_INJECTOR | Freq: Two times a day (BID) | SUBCUTANEOUS | 3 refills | Status: DC
Start: 2022-11-14 — End: 2023-03-07

## 2022-11-14 NOTE — Progress Notes (Signed)
Established Patient Office Visit  Subjective:  Patient ID: Lauren Mueller, female    DOB: 1955-06-09  Age: 67 y.o. MRN: [id number]  Chief Complaint  Patient presents with   Follow-up    3 month follow up    Patient is here for her follow-up.  She says she has been feeling well but is tired since she takes care of her husband.  She has been seen by the neurologist and diagnosed with bilateral carpal tunnel syndrome.  Responding well to Lyrica. She was also started on primidone for her tremor which has improved significantly. She is fasting today for blood work.  Reports that she is taking her medications regularly and is also controlling her diet.  She had her mammogram done but is still waiting to get an eye exam.    No other concerns at this time.   Past Medical History:  Diagnosis Date   Anxiety    Arthritis    Depression    Diabetes mellitus, type 2 (HCC) 01/09/2014   DKA, type 2 (HCC) 09/14/2016   Hyperlipidemia associated with type 2 diabetes mellitus (HCC)    Hypertension associated with diabetes (HCC)    Type 2 diabetes mellitus treated with insulin (HCC)     Past Surgical History:  Procedure Laterality Date   ABDOMINAL HYSTERECTOMY     CESAREAN SECTION     x3   HERNIA REPAIR      Social History   Socioeconomic History   Marital status: Married    Spouse name: Not on file   Number of children: 5   Years of education: 13   Highest education level: Some college, no degree  Occupational History   Occupation: unemployed  Tobacco Use   Smoking status: Never   Smokeless tobacco: Never  Vaping Use   Vaping status: Never Used  Substance and Sexual Activity   Alcohol use: No    Alcohol/week: 0.0 standard drinks of alcohol   Drug use: No   Sexual activity: Not on file  Other Topics Concern   Not on file  Social History Narrative   PHQ 9 administered to patient score of 12. At this time medication is not recommended and follow up scheduled with Carey Bullocks  LCSW scheduled on January 3rd at 10:30 am.       GAD 7 administered to the patient score of 11. Follow up for therapy in two weeks.       Social Determinants tool administered with the patient: finances are a strain. Patient has applied for disability and asked about SSI. Follow up in two weeks, resources for SSI check list to be provided along with options for the application process.    Social Determinants of Health   Financial Resource Strain: High Risk (03/23/2017)   Overall Financial Resource Strain (CARDIA)    Difficulty of Paying Living Expenses: Hard  Food Insecurity: No Food Insecurity (03/23/2017)   Hunger Vital Sign    Worried About Running Out of Food in the Last Year: Never true    Ran Out of Food in the Last Year: Never true  Transportation Needs: No Transportation Needs (03/23/2017)   PRAPARE - Administrator, Civil Service (Medical): No    Lack of Transportation (Non-Medical): No  Physical Activity: Sufficiently Active (03/23/2017)   Exercise Vital Sign    Days of Exercise per Week: 7 days    Minutes of Exercise per Session: 40 min  Stress: Stress Concern Present (03/23/2017)   [place]  [employer] of Occupational Health - Occupational Stress Questionnaire    Feeling of Stress : Very much  Social Connections: Moderately Integrated (03/23/2017)   Social Connection and Isolation Panel [NHANES]    Frequency of Communication with Friends and Family: Never    Frequency of Social Gatherings with Friends and Family: Once a week    Attends Religious Services: More than 4 times per year    Active Member of [employer] or Organizations: Yes    Attends Engineer, structural: More than 4 times per year    Marital Status: Married  Catering manager Violence: Not At Risk (03/23/2017)   Humiliation, Afraid, Rape, and Kick questionnaire    Fear of Current or Ex-Partner: No    Emotionally Abused: No    Physically Abused: No    Sexually Abused: No    Family History   Problem Relation Age of Onset   Hypertension Mother    Hypertension Sister    Breast cancer Neg Hx     No Known Allergies  Review of Systems  Constitutional:  Positive for malaise/fatigue. Negative for chills, diaphoresis, fever and weight loss.  HENT: Negative.  Negative for congestion and sore throat.   Eyes: Negative.  Negative for blurred vision, double vision, pain and discharge.  Respiratory: Negative.  Negative for cough, shortness of breath and stridor.   Cardiovascular: Negative.  Negative for chest pain, palpitations and leg swelling.  Gastrointestinal: Negative.  Negative for abdominal pain, constipation, diarrhea, heartburn, nausea and vomiting.  Genitourinary: Negative.  Negative for dysuria and flank pain.  Musculoskeletal: Negative.  Negative for joint pain and myalgias.  Skin: Negative.   Neurological: Negative.  Negative for dizziness and headaches.  Endo/Heme/Allergies: Negative.   Psychiatric/Behavioral: Negative.  Negative for depression and suicidal ideas. The patient is not nervous/anxious.        Objective:   BP 138/88   Pulse 71   Ht 5\' 4"  (1.626 m)   Wt 205 lb 3.2 oz (93.1 kg)   SpO2 97%   BMI 35.22 kg/m   Vitals:   11/14/22 0857  BP: 138/88  Pulse: 71  Height: 5\' 4"  (1.626 m)  Weight: 205 lb 3.2 oz (93.1 kg)  SpO2: 97%  BMI (Calculated): 35.21    Physical Exam Vitals and nursing note reviewed.  Constitutional:      Appearance: Normal appearance.  HENT:     Head: Normocephalic and atraumatic.     Nose: Nose normal.     Mouth/Throat:     Mouth: Mucous membranes are moist.     Pharynx: Oropharynx is clear.  Eyes:     Conjunctiva/sclera: Conjunctivae normal.     Pupils: Pupils are equal, round, and reactive to light.  Cardiovascular:     Rate and Rhythm: Normal rate and regular rhythm.     Pulses: Normal pulses.     Heart sounds: Normal heart sounds. No murmur heard. Pulmonary:     Effort: Pulmonary effort is normal.     Breath  sounds: Normal breath sounds. No wheezing.  Abdominal:     General: Bowel sounds are normal.     Palpations: Abdomen is soft.     Tenderness: There is no abdominal tenderness. There is no right CVA tenderness or left CVA tenderness.  Musculoskeletal:        General: Normal range of motion.     Cervical back: Normal range of motion.     Right lower leg: No edema.     Left lower leg: No  edema.  Skin:    General: Skin is warm and dry.  Neurological:     General: No focal deficit present.     Mental Status: She is alert and oriented to person, place, and time.  Psychiatric:        Mood and Affect: Mood normal.        Behavior: Behavior normal.      Results for orders placed or performed in visit on 11/14/22  POCT CBG (Fasting - Glucose)  Result Value Ref Range   Glucose Fasting, POC 220 (A) 70 - 99 mg/dL    Recent Results (from the past 2160 hour(s))  POCT CBG (Fasting - Glucose)     Status: Abnormal   Collection Time: 11/14/22  9:04 AM  Result Value Ref Range   Glucose Fasting, POC 220 (A) 70 - 99 mg/dL      Assessment & Plan:  Patient advised to continue medication and strict diet control.  Make an appointment for an eye exam. Problem List Items Addressed This Visit     Type 2 diabetes mellitus (HCC) - Primary   Relevant Medications   metFORMIN (GLUCOPHAGE) 1000 MG tablet   insulin isophane & regular human KwikPen (HUMULIN 70/30 KWIKPEN) (70-30) 100 UNIT/ML KwikPen   blood glucose meter kit and supplies KIT   Other Relevant Orders   POCT CBG (Fasting - Glucose) (Completed)   Hemoglobin A1c   Essential hypertension, benign   Relevant Orders   CMP14+EGFR   Mixed hyperlipidemia   Relevant Medications   metFORMIN (GLUCOPHAGE) 1000 MG tablet   Other Relevant Orders   Lipid Panel w/o Chol/HDL Ratio   Chronic gout of multiple sites   Relevant Orders   CBC With Differential   Uric acid    Return in about 3 months (around 02/14/2023).   Total time spent: 30  minutes  Margaretann Loveless, MD  11/14/2022   This document may have been prepared by [facility] Voice Recognition software and as such may include unintentional dictation errors.

## 2022-11-17 ENCOUNTER — Other Ambulatory Visit: Payer: Self-pay | Admitting: Internal Medicine

## 2022-11-17 DIAGNOSIS — E119 Type 2 diabetes mellitus without complications: Secondary | ICD-10-CM

## 2022-11-17 MED ORDER — INSULIN LISPRO 100 UNIT/ML IJ SOLN
INTRAMUSCULAR | 3 refills | Status: AC
Start: 2022-11-17 — End: 2023-11-17

## 2022-11-17 NOTE — Progress Notes (Signed)
Patient notified

## 2023-01-09 ENCOUNTER — Other Ambulatory Visit: Payer: Self-pay | Admitting: Internal Medicine

## 2023-01-09 DIAGNOSIS — E1121 Type 2 diabetes mellitus with diabetic nephropathy: Secondary | ICD-10-CM | POA: Diagnosis not present

## 2023-01-09 DIAGNOSIS — E1165 Type 2 diabetes mellitus with hyperglycemia: Secondary | ICD-10-CM | POA: Diagnosis not present

## 2023-01-09 DIAGNOSIS — Z794 Long term (current) use of insulin: Secondary | ICD-10-CM | POA: Diagnosis not present

## 2023-01-09 DIAGNOSIS — I10 Essential (primary) hypertension: Secondary | ICD-10-CM | POA: Diagnosis not present

## 2023-02-06 ENCOUNTER — Telehealth: Payer: Self-pay | Admitting: Internal Medicine

## 2023-02-06 NOTE — Telephone Encounter (Signed)
[facility] [facility] left VM needing a diabetic supply order for the patient

## 2023-02-14 ENCOUNTER — Ambulatory Visit: Payer: Medicare Other | Admitting: Internal Medicine

## 2023-02-20 DIAGNOSIS — R748 Abnormal levels of other serum enzymes: Secondary | ICD-10-CM | POA: Diagnosis not present

## 2023-02-20 DIAGNOSIS — E1165 Type 2 diabetes mellitus with hyperglycemia: Secondary | ICD-10-CM | POA: Diagnosis not present

## 2023-02-20 DIAGNOSIS — E1122 Type 2 diabetes mellitus with diabetic chronic kidney disease: Secondary | ICD-10-CM | POA: Diagnosis not present

## 2023-02-20 DIAGNOSIS — Z794 Long term (current) use of insulin: Secondary | ICD-10-CM | POA: Diagnosis not present

## 2023-02-20 DIAGNOSIS — N183 Chronic kidney disease, stage 3 unspecified: Secondary | ICD-10-CM | POA: Diagnosis not present

## 2023-02-20 DIAGNOSIS — E113299 Type 2 diabetes mellitus with mild nonproliferative diabetic retinopathy without macular edema, unspecified eye: Secondary | ICD-10-CM | POA: Diagnosis not present

## 2023-02-20 DIAGNOSIS — S96912A Strain of unspecified muscle and tendon at ankle and foot level, left foot, initial encounter: Secondary | ICD-10-CM | POA: Diagnosis not present

## 2023-02-26 ENCOUNTER — Other Ambulatory Visit: Payer: Self-pay | Admitting: Internal Medicine

## 2023-03-05 ENCOUNTER — Other Ambulatory Visit: Payer: Self-pay | Admitting: Internal Medicine

## 2023-03-06 ENCOUNTER — Other Ambulatory Visit: Payer: Self-pay | Admitting: Internal Medicine

## 2023-03-06 MED ORDER — VITAMIN D3 1.25 MG (50000 UT) PO CAPS: 1.0000 | ORAL_CAPSULE | ORAL | 1 refills | Status: DC

## 2023-03-07 ENCOUNTER — Ambulatory Visit (INDEPENDENT_AMBULATORY_CARE_PROVIDER_SITE_OTHER): Payer: Medicare Other | Admitting: Internal Medicine

## 2023-03-07 ENCOUNTER — Encounter: Payer: Self-pay | Admitting: Internal Medicine

## 2023-03-07 ENCOUNTER — Ambulatory Visit (INDEPENDENT_AMBULATORY_CARE_PROVIDER_SITE_OTHER): Payer: Medicare Other

## 2023-03-07 VITALS — BP 150/80 | HR 79 | Ht 64.0 in | Wt 205.6 lb

## 2023-03-07 DIAGNOSIS — E119 Type 2 diabetes mellitus without complications: Secondary | ICD-10-CM

## 2023-03-07 DIAGNOSIS — E1159 Type 2 diabetes mellitus with other circulatory complications: Secondary | ICD-10-CM | POA: Insufficient documentation

## 2023-03-07 DIAGNOSIS — M25572 Pain in left ankle and joints of left foot: Secondary | ICD-10-CM | POA: Insufficient documentation

## 2023-03-07 DIAGNOSIS — M1000 Idiopathic gout, unspecified site: Secondary | ICD-10-CM | POA: Diagnosis not present

## 2023-03-07 DIAGNOSIS — Z794 Long term (current) use of insulin: Secondary | ICD-10-CM

## 2023-03-07 DIAGNOSIS — I152 Hypertension secondary to endocrine disorders: Secondary | ICD-10-CM

## 2023-03-07 DIAGNOSIS — E1169 Type 2 diabetes mellitus with other specified complication: Secondary | ICD-10-CM

## 2023-03-07 DIAGNOSIS — E785 Hyperlipidemia, unspecified: Secondary | ICD-10-CM

## 2023-03-07 LAB — GLUCOSE, POCT (MANUAL RESULT ENTRY): POC Glucose: 125 mg/dL — AB (ref 70–99)

## 2023-03-07 MED ORDER — METHYLPREDNISOLONE 4 MG PO TBPK: ORAL_TABLET | ORAL | 0 refills | Status: DC

## 2023-03-07 MED ORDER — COLCHICINE 0.6 MG PO TABS: 0.6000 mg | ORAL_TABLET | Freq: Two times a day (BID) | ORAL | 2 refills | Status: DC

## 2023-03-07 NOTE — Progress Notes (Signed)
Established Patient Office Visit  Subjective:  Patient ID: Lauren Mueller, female    DOB: [birth year]  Age: 67 y.o. MRN: [id number]  Chief Complaint  Patient presents with   Acute Visit    Left ankle pain    Patient comes in with complaints of left ankle pain specially on the medial side.  Patient mentions that 5 months ago she had slightly injured her left ankle, when she slipped and fell outside near a trash can.  She recalls that she was able to get up quickly and go back inside, and had minimal discomfort.  But now for last several weeks the pain is getting worse and she has difficulty with weight bearing on her left ankle and notices swelling along the medial side.  She has pain on plantarflexion.  Patient has been using Motrin, Advil, Aleve as well as Voltaren gel but saw no relief. Patient also has history of gouty arthritis, her last uric acid levels were high.  She is currently on allopurinol. Will check x-ray of the left ankle as well as the uric acid levels. Trial of Medrol Dosepak and colchicine if x-rays are negative.    No other concerns at this time.   Past Medical History:  Diagnosis Date   Anxiety    Arthritis    Depression    Diabetes mellitus, type 2 (HCC) 01/09/2014   DKA, type 2 (HCC) 09/14/2016   Hyperlipidemia associated with type 2 diabetes mellitus (HCC)    Hypertension associated with diabetes (HCC)    Type 2 diabetes mellitus treated with insulin (HCC)     Past Surgical History:  Procedure Laterality Date   ABDOMINAL HYSTERECTOMY     CESAREAN SECTION     x3   HERNIA REPAIR      Social History   Socioeconomic History   Marital status: Married    Spouse name: Not on file   Number of children: 5   Years of education: 13   Highest education level: Some college, no degree  Occupational History   Occupation: unemployed  Tobacco Use   Smoking status: Never   Smokeless tobacco: Never  Vaping Use   Vaping status: Never Used  Substance and Sexual  Activity   Alcohol use: No    Alcohol/week: 0.0 standard drinks of alcohol   Drug use: No   Sexual activity: Not on file  Other Topics Concern   Not on file  Social History Narrative   PHQ 9 administered to patient score of 12. At this time medication is not recommended and follow up scheduled with Carey Bullocks LCSW scheduled on January 3rd at 10:30 am.       GAD 7 administered to the patient score of 11. Follow up for therapy in two weeks.       Social Determinants tool administered with the patient: finances are a strain. Patient has applied for disability and asked about SSI. Follow up in two weeks, resources for SSI check list to be provided along with options for the application process.    Social Determinants of Health   Financial Resource Strain: Low Risk  (01/09/2023)   Received from [facility]   Overall Financial Resource Strain (CARDIA)    Difficulty of Paying Living Expenses: Not hard at all  Food Insecurity: No Food Insecurity (01/09/2023)   Received from [facility]   Hunger Vital Sign    Worried About Running Out of Food in the Last Year: Never true  Ran Out of Food in the Last Year: Never true  Transportation Needs: No Transportation Needs (01/09/2023)   Received from [facility] - Transportation    In the past 12 months, has lack of transportation kept you from medical appointments or from getting medications?: No    Lack of Transportation (Non-Medical): No  Physical Activity: Sufficiently Active (03/23/2017)   Exercise Vital Sign    Days of Exercise per Week: 7 days    Minutes of Exercise per Session: 40 min  Stress: Stress Concern Present (03/23/2017)   [employer] of Occupational Health - Occupational Stress Questionnaire    Feeling of Stress : Very much  Social Connections: Moderately Integrated (03/23/2017)   Social Connection and Isolation Panel [NHANES]    Frequency of Communication  with Friends and Family: Never    Frequency of Social Gatherings with Friends and Family: Once a week    Attends Religious Services: More than 4 times per year    Active Member of [employer] or Organizations: Yes    Attends Engineer, structural: More than 4 times per year    Marital Status: Married  Catering manager Violence: Not At Risk (03/23/2017)   Humiliation, Afraid, Rape, and Kick questionnaire    Fear of Current or Ex-Partner: No    Emotionally Abused: No    Physically Abused: No    Sexually Abused: No    Family History  Problem Relation Age of Onset   Hypertension Mother    Hypertension Sister    Breast cancer Neg Hx     No Known Allergies  Outpatient Medications Prior to Visit  Medication Sig   acetaminophen (TYLENOL) 500 MG tablet Take 500 mg by mouth every 6 (six) hours as needed.   allopurinol (ZYLOPRIM) 100 MG tablet TAKE ONE TABLET BY MOUTH TWICE A DAY   amLODipine (NORVASC) 10 MG tablet TAKE ONE TABLET BY MOUTH ONE TIME DAILY   atorvastatin (LIPITOR) 40 MG tablet TAKE ONE TABLET BY MOUTH ONE TIME DAILY   blood glucose meter kit and supplies KIT Dispense based on patient and insurance preference. Use up to four times daily as directed. (FOR ICD-9 250.00, 250.01).   carvedilol (COREG) 25 MG tablet TAKE ONE TABLET BY MOUTH 2 TIMES A DAY WITH A MEAL.   Cholecalciferol (VITAMIN D3) 1.25 MG (50000 UT) CAPS Take 1 capsule (1.25 mg total) by mouth once a week.   Continuous Glucose Sensor (DEXCOM G7 SENSOR) MISC Inject 1 each into the muscle once. Every 10 days   lisinopril-hydrochlorothiazide (ZESTORETIC) 20-12.5 MG tablet TAKE TWO TABLETS BY MOUTH EVERY MORNING FOR BLOOD PRESSURE   metFORMIN (GLUCOPHAGE) 1000 MG tablet Take 1 tablet (1,000 mg total) by mouth 2 (two) times daily with a meal.   Semaglutide,0.25 or 0.5MG /DOS, 2 MG/3ML SOPN Inject 0.5 mLs into the skin every 7 (seven) days.   TRESIBA FLEXTOUCH 200 UNIT/ML FlexTouch Pen Inject 50 mLs into the skin daily.    azithromycin (ZITHROMAX) 250 MG tablet TAKE TWO TABLETS BY MOUTH ON DAY 1, THEN TAKE ONE TABLET ONE TIME DAILY ON DAYS 2-5 (Patient not taking: Reported on 03/07/2023)   Cholecalciferol 1.25 MG (50000 UT) capsule Take 1 capsule by mouth once a week. (Patient not taking: Reported on 03/07/2023)   insulin lispro (HUMALOG) 100 UNIT/ML injection PLEASE USE WITH SLIDING SCALE TWICE A DAY. (MAX DOSE OF 12 UNITS FOR BLOOD SUGAR BETWEEN 301-350) (Patient not taking: Reported on 03/07/2023)   pregabalin (LYRICA) 25 MG capsule  Take 25 mg by mouth daily. (Patient not taking: Reported on 03/07/2023)   primidone (MYSOLINE) 50 MG tablet Take 25 mg by mouth at bedtime. (Patient not taking: Reported on 03/07/2023)   [DISCONTINUED] insulin isophane & regular human KwikPen (HUMULIN 70/30 KWIKPEN) (70-30) 100 UNIT/ML KwikPen Inject 38 Units into the skin in the morning and at bedtime. (Patient not taking: Reported on 03/07/2023)   No facility-administered medications prior to visit.    Review of Systems  Constitutional: Negative.  Negative for chills, diaphoresis, fever, malaise/fatigue and weight loss.  HENT: Negative.  Negative for congestion, sinus pain and sore throat.   Eyes: Negative.   Respiratory: Negative.  Negative for cough and shortness of breath.   Cardiovascular: Negative.  Negative for chest pain, palpitations and leg swelling.  Gastrointestinal: Negative.  Negative for abdominal pain, constipation, diarrhea, heartburn, nausea and vomiting.  Genitourinary: Negative.  Negative for dysuria and flank pain.  Musculoskeletal:  Positive for joint pain (left ankle). Negative for myalgias.  Skin: Negative.   Neurological: Negative.  Negative for dizziness and headaches.  Endo/Heme/Allergies: Negative.   Psychiatric/Behavioral: Negative.  Negative for depression and suicidal ideas. The patient is not nervous/anxious.        Objective:   BP (!) 150/80   Pulse 79   Ht 5\' 4"  (1.626 m)   Wt 205 lb 9.6 oz  (93.3 kg)   SpO2 97%   BMI 35.29 kg/m   Vitals:   03/07/23 0857  BP: (!) 150/80  Pulse: 79  Height: 5\' 4"  (1.626 m)  Weight: 205 lb 9.6 oz (93.3 kg)  SpO2: 97%  BMI (Calculated): 35.27    Physical Exam Vitals and nursing note reviewed.  Constitutional:      Appearance: Normal appearance.  HENT:     Head: Normocephalic and atraumatic.     Nose: Nose normal.     Mouth/Throat:     Mouth: Mucous membranes are moist.     Pharynx: Oropharynx is clear.  Eyes:     Conjunctiva/sclera: Conjunctivae normal.     Pupils: Pupils are equal, round, and reactive to light.  Cardiovascular:     Rate and Rhythm: Normal rate and regular rhythm.     Pulses: Normal pulses.     Heart sounds: Normal heart sounds. No murmur heard. Pulmonary:     Effort: Pulmonary effort is normal.     Breath sounds: Normal breath sounds. No wheezing.  Abdominal:     General: Bowel sounds are normal.     Palpations: Abdomen is soft.     Tenderness: There is no abdominal tenderness. There is no right CVA tenderness or left CVA tenderness.  Musculoskeletal:        General: Swelling (left ankle) present. Normal range of motion.     Cervical back: Normal range of motion.     Right lower leg: No edema.     Left lower leg: No edema.  Skin:    General: Skin is warm and dry.  Neurological:     General: No focal deficit present.     Mental Status: She is alert and oriented to person, place, and time.  Psychiatric:        Mood and Affect: Mood normal.        Behavior: Behavior normal.      Results for orders placed or performed in visit on 03/07/23  POCT Glucose (CBG)  Result Value Ref Range   POC Glucose 125 (A) 70 - 99 mg/dl    Recent  Results (from the past 2160 hour(s))  POCT Glucose (CBG)     Status: Abnormal   Collection Time: 03/07/23  9:07 AM  Result Value Ref Range   POC Glucose 125 (A) 70 - 99 mg/dl      Assessment & Plan:  X-ray left ankle. Report says there is arthritis- Will start  Medrol Dosepak as well as colchicine.  Problem List Items Addressed This Visit     Type 2 diabetes mellitus (HCC)   Relevant Medications   TRESIBA FLEXTOUCH 200 UNIT/ML FlexTouch Pen   Semaglutide,0.25 or 0.5MG /DOS, 2 MG/3ML SOPN   Other Relevant Orders   POCT Glucose (CBG) (Completed)   Uric acid   Hyperlipidemia associated with type 2 diabetes mellitus (HCC)   Relevant Medications   TRESIBA FLEXTOUCH 200 UNIT/ML FlexTouch Pen   Semaglutide,0.25 or 0.5MG /DOS, 2 MG/3ML SOPN   Hypertension associated with diabetes (HCC) - Primary   Relevant Medications   TRESIBA FLEXTOUCH 200 UNIT/ML FlexTouch Pen   Semaglutide,0.25 or 0.5MG /DOS, 2 MG/3ML SOPN   Acute idiopathic gout   Relevant Medications   methylPREDNISolone (MEDROL DOSEPAK) 4 MG TBPK tablet   colchicine 0.6 MG tablet   Other Relevant Orders   CBC with Diff   Left ankle pain   Relevant Orders   DG Ankle Complete Left (Completed)    Return in about 1 week (around 03/14/2023).   Total time spent: 30 minutes  Margaretann Loveless, MD  03/07/2023   This document may have been prepared by [facility] Voice Recognition software and as such may include unintentional dictation errors.

## 2023-03-07 NOTE — Progress Notes (Signed)
Patient notified

## 2023-03-08 LAB — CBC WITH DIFFERENTIAL/PLATELET
Basophils Absolute: 0.1 10*3/uL (ref 0.0–0.2)
Basos: 1 %
EOS (ABSOLUTE): 0.1 10*3/uL (ref 0.0–0.4)
Eos: 1 %
Hematocrit: 41.1 % (ref 34.0–46.6)
Hemoglobin: 13.2 g/dL (ref 11.1–15.9)
Immature Grans (Abs): 0 10*3/uL (ref 0.0–0.1)
Immature Granulocytes: 0 %
Lymphocytes Absolute: 3.2 10*3/uL — ABNORMAL HIGH (ref 0.7–3.1)
Lymphs: 41 %
MCH: 26.9 pg (ref 26.6–33.0)
MCHC: 32.1 g/dL (ref 31.5–35.7)
MCV: 84 fL (ref 79–97)
Monocytes Absolute: 0.5 10*3/uL (ref 0.1–0.9)
Monocytes: 6 %
Neutrophils Absolute: 4 10*3/uL (ref 1.4–7.0)
Neutrophils: 51 %
Platelets: 441 10*3/uL (ref 150–450)
RBC: 4.91 x10E6/uL (ref 3.77–5.28)
RDW: 14.5 % (ref 11.7–15.4)
WBC: 7.9 10*3/uL (ref 3.4–10.8)

## 2023-03-08 LAB — URIC ACID: Uric Acid: 8 mg/dL — ABNORMAL HIGH (ref 3.0–7.2)

## 2023-03-09 ENCOUNTER — Other Ambulatory Visit: Payer: Self-pay | Admitting: Internal Medicine

## 2023-03-13 ENCOUNTER — Ambulatory Visit: Payer: Medicare Other | Admitting: Cardiology

## 2023-03-20 ENCOUNTER — Other Ambulatory Visit: Payer: Self-pay | Admitting: Internal Medicine

## 2023-03-20 DIAGNOSIS — M1000 Idiopathic gout, unspecified site: Secondary | ICD-10-CM

## 2023-03-29 ENCOUNTER — Other Ambulatory Visit: Payer: Self-pay | Admitting: Internal Medicine

## 2023-03-29 DIAGNOSIS — M1000 Idiopathic gout, unspecified site: Secondary | ICD-10-CM

## 2023-05-02 ENCOUNTER — Other Ambulatory Visit: Payer: Self-pay | Admitting: Internal Medicine

## 2023-05-02 DIAGNOSIS — M1000 Idiopathic gout, unspecified site: Secondary | ICD-10-CM

## 2023-05-05 NOTE — Telephone Encounter (Signed)
Sent mychart message to have patient schedule appt if the rx refill is needed

## 2023-05-29 DIAGNOSIS — I10 Essential (primary) hypertension: Secondary | ICD-10-CM | POA: Diagnosis not present

## 2023-05-29 DIAGNOSIS — T383X5A Adverse effect of insulin and oral hypoglycemic [antidiabetic] drugs, initial encounter: Secondary | ICD-10-CM | POA: Diagnosis not present

## 2023-05-29 DIAGNOSIS — E160 Drug-induced hypoglycemia without coma: Secondary | ICD-10-CM | POA: Diagnosis not present

## 2023-05-29 DIAGNOSIS — E66811 Obesity, class 1: Secondary | ICD-10-CM | POA: Diagnosis not present

## 2023-05-29 DIAGNOSIS — N183 Chronic kidney disease, stage 3 unspecified: Secondary | ICD-10-CM | POA: Diagnosis not present

## 2023-05-29 DIAGNOSIS — E1122 Type 2 diabetes mellitus with diabetic chronic kidney disease: Secondary | ICD-10-CM | POA: Diagnosis not present

## 2023-06-19 ENCOUNTER — Other Ambulatory Visit: Payer: Self-pay | Admitting: Internal Medicine

## 2023-06-19 DIAGNOSIS — Z1231 Encounter for screening mammogram for malignant neoplasm of breast: Secondary | ICD-10-CM

## 2023-06-30 ENCOUNTER — Ambulatory Visit
Admission: RE | Admit: 2023-06-30 | Discharge: 2023-06-30 | Disposition: A | Discharge status: Home / Self Care | Payer: Medicare (Managed Care) | Source: Ambulatory Visit | Attending: Internal Medicine | Admitting: Internal Medicine

## 2023-06-30 DIAGNOSIS — Z1231 Encounter for screening mammogram for malignant neoplasm of breast: Secondary | ICD-10-CM | POA: Insufficient documentation

## 2023-07-03 ENCOUNTER — Other Ambulatory Visit: Payer: Self-pay | Admitting: Internal Medicine

## 2023-07-03 DIAGNOSIS — M1000 Idiopathic gout, unspecified site: Secondary | ICD-10-CM

## 2023-07-25 ENCOUNTER — Other Ambulatory Visit: Payer: Self-pay | Admitting: Internal Medicine

## 2023-07-25 DIAGNOSIS — R29898 Other symptoms and signs involving the musculoskeletal system: Secondary | ICD-10-CM | POA: Diagnosis not present

## 2023-07-25 DIAGNOSIS — R200 Anesthesia of skin: Secondary | ICD-10-CM | POA: Diagnosis not present

## 2023-07-25 DIAGNOSIS — I10 Essential (primary) hypertension: Secondary | ICD-10-CM

## 2023-07-25 DIAGNOSIS — M6530 Trigger finger, unspecified finger: Secondary | ICD-10-CM | POA: Diagnosis not present

## 2023-07-25 DIAGNOSIS — G5603 Carpal tunnel syndrome, bilateral upper limbs: Secondary | ICD-10-CM | POA: Diagnosis not present

## 2023-07-25 DIAGNOSIS — R202 Paresthesia of skin: Secondary | ICD-10-CM | POA: Diagnosis not present

## 2023-07-25 DIAGNOSIS — G250 Essential tremor: Secondary | ICD-10-CM | POA: Diagnosis not present

## 2023-07-25 DIAGNOSIS — R2689 Other abnormalities of gait and mobility: Secondary | ICD-10-CM | POA: Diagnosis not present

## 2023-08-01 ENCOUNTER — Ambulatory Visit: Payer: Medicare (Managed Care) | Admitting: Internal Medicine

## 2023-08-14 ENCOUNTER — Other Ambulatory Visit: Payer: Self-pay | Admitting: Internal Medicine

## 2023-08-14 DIAGNOSIS — I10 Essential (primary) hypertension: Secondary | ICD-10-CM

## 2023-08-16 ENCOUNTER — Other Ambulatory Visit: Payer: Self-pay | Admitting: Internal Medicine

## 2023-08-16 DIAGNOSIS — M1000 Idiopathic gout, unspecified site: Secondary | ICD-10-CM

## 2023-08-24 DIAGNOSIS — G5601 Carpal tunnel syndrome, right upper limb: Secondary | ICD-10-CM | POA: Diagnosis not present

## 2023-08-24 DIAGNOSIS — G8929 Other chronic pain: Secondary | ICD-10-CM | POA: Diagnosis not present

## 2023-08-24 DIAGNOSIS — M65332 Trigger finger, left middle finger: Secondary | ICD-10-CM | POA: Diagnosis not present

## 2023-08-24 DIAGNOSIS — G5603 Carpal tunnel syndrome, bilateral upper limbs: Secondary | ICD-10-CM | POA: Diagnosis not present

## 2023-08-24 DIAGNOSIS — M545 Low back pain, unspecified: Secondary | ICD-10-CM | POA: Diagnosis not present

## 2023-08-24 DIAGNOSIS — M65331 Trigger finger, right middle finger: Secondary | ICD-10-CM | POA: Diagnosis not present

## 2023-08-29 ENCOUNTER — Other Ambulatory Visit: Payer: Self-pay | Admitting: Internal Medicine

## 2023-08-30 DIAGNOSIS — E1122 Type 2 diabetes mellitus with diabetic chronic kidney disease: Secondary | ICD-10-CM | POA: Diagnosis not present

## 2023-08-30 DIAGNOSIS — N183 Chronic kidney disease, stage 3 unspecified: Secondary | ICD-10-CM | POA: Diagnosis not present

## 2023-08-30 DIAGNOSIS — E66811 Obesity, class 1: Secondary | ICD-10-CM | POA: Diagnosis not present

## 2023-08-30 DIAGNOSIS — Z1331 Encounter for screening for depression: Secondary | ICD-10-CM | POA: Diagnosis not present

## 2023-08-30 DIAGNOSIS — Z794 Long term (current) use of insulin: Secondary | ICD-10-CM | POA: Diagnosis not present

## 2023-09-05 ENCOUNTER — Other Ambulatory Visit: Payer: Self-pay | Admitting: Internal Medicine

## 2023-09-05 ENCOUNTER — Telehealth: Payer: Self-pay

## 2023-09-05 NOTE — Progress Notes (Signed)
   09/05/2023  Patient ID: Lauren Mueller, female   DOB: 02/24/1956, 68 y.o.   MRN: [id number]  Pharmacy Quality Measure Review  This patient is appearing on a report for being at risk of failing the Glycemic Status Assessment in Diabetes measure this calendar year.   Last documented A1c or GMI 7.9 on 08/30/23  Attempted to contact patient to discuss recent med changes at endo appt and current glycemic control. Unable to leave voicemail, will continue to attempt to reach.  Carnell Christian, PharmD Clinical Pharmacist [phone]

## 2023-09-13 DIAGNOSIS — G5602 Carpal tunnel syndrome, left upper limb: Secondary | ICD-10-CM | POA: Diagnosis not present

## 2023-09-19 ENCOUNTER — Ambulatory Visit: Payer: Self-pay | Admitting: Internal Medicine

## 2023-09-19 ENCOUNTER — Encounter: Payer: Self-pay | Admitting: Internal Medicine

## 2023-09-19 ENCOUNTER — Ambulatory Visit (INDEPENDENT_AMBULATORY_CARE_PROVIDER_SITE_OTHER): Payer: Medicare (Managed Care) | Admitting: Internal Medicine

## 2023-09-19 VITALS — BP 150/94 | HR 63 | Ht 64.0 in | Wt 193.2 lb

## 2023-09-19 DIAGNOSIS — E790 Hyperuricemia without signs of inflammatory arthritis and tophaceous disease: Secondary | ICD-10-CM | POA: Diagnosis not present

## 2023-09-19 DIAGNOSIS — I351 Nonrheumatic aortic (valve) insufficiency: Secondary | ICD-10-CM | POA: Diagnosis not present

## 2023-09-19 DIAGNOSIS — E782 Mixed hyperlipidemia: Secondary | ICD-10-CM

## 2023-09-19 DIAGNOSIS — R42 Dizziness and giddiness: Secondary | ICD-10-CM | POA: Diagnosis not present

## 2023-09-19 DIAGNOSIS — Z794 Long term (current) use of insulin: Secondary | ICD-10-CM

## 2023-09-19 DIAGNOSIS — I152 Hypertension secondary to endocrine disorders: Secondary | ICD-10-CM

## 2023-09-19 DIAGNOSIS — G252 Other specified forms of tremor: Secondary | ICD-10-CM

## 2023-09-19 DIAGNOSIS — E1169 Type 2 diabetes mellitus with other specified complication: Secondary | ICD-10-CM | POA: Diagnosis not present

## 2023-09-19 DIAGNOSIS — E1159 Type 2 diabetes mellitus with other circulatory complications: Secondary | ICD-10-CM

## 2023-09-19 LAB — POCT CBG (FASTING - GLUCOSE)-MANUAL ENTRY: Glucose Fasting, POC: 109 mg/dL — AB (ref 70–99)

## 2023-09-19 MED ORDER — PRIMIDONE 50 MG PO TABS: 50.0000 mg | ORAL_TABLET | Freq: Every day | ORAL | 6 refills | Status: AC

## 2023-09-19 NOTE — Progress Notes (Signed)
 Established Patient Office Visit  Subjective:  Patient ID: Lauren Mueller, female    DOB: 04/22/1955  Age: 68 y.o. MRN: [id number]  Chief Complaint  Patient presents with   Follow-up    Discuss dizziness     Patient comes in for her follow-up today.  She is under the care of endocrinologist and her most recent hemoglobin A1c is reportedly in the sevens.  She is also on Ozempic 0.5 mg/week and has lost weight.  She is generally feeling well but mentions 2-3 episodes of dizziness where she felt very lightheaded and unsteady on her feet.  Each episode lasted several minutes and resolved spontaneously.  She did not feel any chest pain, no shortness of breath no palpitations and diaphoresis during these episodes.  She wears a CGM and her glucose was not low at that time.  She denies heartburn, no nausea vomiting and no abdominal pain.  Will check her EKG, schedule carotid Dopplers, and echocardiogram. Her blood pressure is high today as did not take her medications yet.    No other concerns at this time.   Past Medical History:  Diagnosis Date   Anxiety    Arthritis    Depression    Diabetes mellitus, type 2 (HCC) 01/09/2014   DKA, type 2 (HCC) 09/14/2016   Hyperlipidemia associated with type 2 diabetes mellitus (HCC)    Hypertension associated with diabetes (HCC)    Type 2 diabetes mellitus treated with insulin  [employer])     Past Surgical History:  Procedure Laterality Date   ABDOMINAL HYSTERECTOMY     CESAREAN SECTION     x3   HERNIA REPAIR      Social History   Socioeconomic History   Marital status: Married    Spouse name: Not on file   Number of children: 5   Years of education: 13   Highest education level: Some college, no degree  Occupational History   Occupation: unemployed  Tobacco Use   Smoking status: Never   Smokeless tobacco: Never  Vaping Use   Vaping status: Never Used  Substance and Sexual Activity   Alcohol use: No    Alcohol/week: 0.0 standard drinks  of alcohol   Drug use: No   Sexual activity: Not on file  Other Topics Concern   Not on file  Social History Narrative   PHQ 9 administered to patient score of 12. At this time medication is not recommended and follow up scheduled with Arther Bill LCSW scheduled on January 3rd at 10:30 am.       GAD 7 administered to the patient score of 11. Follow up for therapy in two weeks.       Social Determinants tool administered with the patient: finances are a strain. Patient has applied for disability and asked about SSI. Follow up in two weeks, resources for SSI check list to be provided along with options for the application process.    Social Drivers of Corporate investment banker Strain: Low Risk  (08/24/2023)   Received from [facility]   Overall Financial Resource Strain (CARDIA)    Difficulty of Paying Living Expenses: Not hard at all  Food Insecurity: No Food Insecurity (08/24/2023)   Received from [facility]   Hunger Vital Sign    Within the past 12 months, you worried that your food would run out before you got the money to buy more.: Never true    Within the past 12 months, the  food you bought just didn't last and you didn't have money to get more.: Never true  Transportation Needs: No Transportation Needs (08/24/2023)   Received from [facility] Transportation    In the past 12 months, has lack of transportation kept you from medical appointments or from getting medications?: No    Lack of Transportation (Non-Medical): No  Physical Activity: Sufficiently Active (03/23/2017)   Exercise Vital Sign    Days of Exercise per Week: 7 days    Minutes of Exercise per Session: 40 min  Stress: Stress Concern Present (03/23/2017)   [employer] of Occupational Health - Occupational Stress Questionnaire    Feeling of Stress : Very much  Social Connections: Moderately Integrated (03/23/2017)   Social Connection and  Isolation Panel    Frequency of Communication with Friends and Family: Never    Frequency of Social Gatherings with Friends and Family: Once a week    Attends Religious Services: More than 4 times per year    Active Member of [employer] or Organizations: Yes    Attends Engineer, structural: More than 4 times per year    Marital Status: Married  Catering manager Violence: Not At Risk (03/23/2017)   Humiliation, Afraid, Rape, and Kick questionnaire    Fear of Current or Ex-Partner: No    Emotionally Abused: No    Physically Abused: No    Sexually Abused: No    Family History  Problem Relation Age of Onset   Hypertension Mother    Hypertension Sister    Breast cancer Neg Hx     No Known Allergies  Outpatient Medications Prior to Visit  Medication Sig   acetaminophen  (TYLENOL ) 500 MG tablet Take 500 mg by mouth every 6 (six) hours as needed.   allopurinol (ZYLOPRIM) 100 MG tablet TAKE ONE TABLET BY MOUTH TWICE A DAY   amLODipine  (NORVASC ) 10 MG tablet TAKE ONE TABLET BY MOUTH ONE TIME DAILY   atorvastatin  (LIPITOR) 40 MG tablet TAKE ONE TABLET BY MOUTH ONE TIME DAILY   blood glucose meter kit and supplies KIT Dispense based on patient and insurance preference. Use up to four times daily as directed. (FOR ICD-9 250.00, 250.01).   carvedilol  (COREG ) 25 MG tablet TAKE ONE TABLET BY MOUTH 2 TIMES A DAY WITH A MEAL.   Cholecalciferol (VITAMIN D3) 1.25 MG (50000 UT) CAPS TAKE ONE CAPSULE BY MOUTH EVERY WEEK   colchicine  0.6 MG tablet TAKE ONE TABLET BY MOUTH TWO TIMES DAILY   Continuous Glucose Sensor (DEXCOM G7 SENSOR) MISC Inject 1 each into the muscle once. Every 10 days   lisinopril -hydrochlorothiazide  (ZESTORETIC) 20-12.5 MG tablet TAKE TWO TABLETS BY MOUTH EVERY MORNING FOR BLOOD PRESSURE   metFORMIN  (GLUCOPHAGE ) 1000 MG tablet Take 1 tablet (1,000 mg total) by mouth 2 (two) times daily with a meal.   methylPREDNISolone  (MEDROL  DOSEPAK) 4 MG TBPK tablet USE AS DIRECTED ON THE  PACK   Semaglutide,0.25 or 0.5MG /DOS, 2 MG/3ML SOPN Inject 0.5 mLs into the skin every 7 (seven) days.   TRESIBA FLEXTOUCH 200 UNIT/ML FlexTouch Pen Inject 50 mLs into the skin daily.   ULTICARE PEN NEEDLES 31G X 5 MM MISC USE WITH NOVOLOG  PEN TWICE A DAY   insulin  lispro (HUMALOG ) 100 UNIT/ML injection PLEASE USE WITH SLIDING SCALE TWICE A DAY. (MAX DOSE OF 12 UNITS FOR BLOOD SUGAR BETWEEN 301-350) (Patient not taking: Reported on 09/19/2023)   pregabalin (LYRICA) 25 MG capsule Take 25 mg by mouth daily. (  Patient not taking: Reported on 09/19/2023)   [DISCONTINUED] azithromycin (ZITHROMAX) 250 MG tablet TAKE TWO TABLETS BY MOUTH ON DAY 1, THEN TAKE ONE TABLET ONE TIME DAILY ON DAYS 2-5 (Patient not taking: Reported on 09/19/2023)   [DISCONTINUED] primidone (MYSOLINE) 50 MG tablet Take 25 mg by mouth at bedtime. (Patient not taking: Reported on 09/19/2023)   No facility-administered medications prior to visit.    Review of Systems  Constitutional: Negative.  Negative for chills, diaphoresis, fever, malaise/fatigue and weight loss.  HENT: Negative.  Negative for congestion and sore throat.   Eyes: Negative.   Respiratory: Negative.  Negative for cough and shortness of breath.   Cardiovascular: Negative.  Negative for chest pain, palpitations and leg swelling.  Gastrointestinal: Negative.  Negative for abdominal pain, blood in stool, constipation, diarrhea, heartburn, melena, nausea and vomiting.  Genitourinary: Negative.  Negative for dysuria and flank pain.  Musculoskeletal: Negative.  Negative for joint pain and myalgias.  Skin: Negative.   Neurological:  Positive for dizziness. Negative for tingling, tremors, sensory change, speech change, focal weakness, seizures, loss of consciousness, weakness and headaches.  Endo/Heme/Allergies: Negative.   Psychiatric/Behavioral: Negative.  Negative for depression and suicidal ideas. The patient is not nervous/anxious.        Objective:   BP (!)  150/94   Pulse 63   Ht 5' 4 (1.626 m)   Wt 193 lb 3.2 oz (87.6 kg)   SpO2 98%   BMI 33.16 kg/m   Vitals:   09/19/23 1015  BP: (!) 150/94  Pulse: 63  Height: 5' 4 (1.626 m)  Weight: 193 lb 3.2 oz (87.6 kg)  SpO2: 98%  BMI (Calculated): 33.15    Physical Exam Vitals and nursing note reviewed.  Constitutional:      Appearance: Normal appearance.  HENT:     Head: Normocephalic and atraumatic.     Nose: Nose normal.     Mouth/Throat:     Mouth: Mucous membranes are moist.     Pharynx: Oropharynx is clear.   Eyes:     Conjunctiva/sclera: Conjunctivae normal.     Pupils: Pupils are equal, round, and reactive to light.    Cardiovascular:     Rate and Rhythm: Normal rate and regular rhythm.     Pulses: Normal pulses.     Heart sounds: Murmur heard.  Pulmonary:     Effort: Pulmonary effort is normal.     Breath sounds: Normal breath sounds. No wheezing.  Abdominal:     General: Bowel sounds are normal.     Palpations: Abdomen is soft.     Tenderness: There is no abdominal tenderness. There is no right CVA tenderness or left CVA tenderness.   Musculoskeletal:        General: Normal range of motion.     Cervical back: Normal range of motion.     Right lower leg: No edema.     Left lower leg: No edema.   Skin:    General: Skin is warm and dry.   Neurological:     General: No focal deficit present.     Mental Status: She is alert and oriented to person, place, and time.   Psychiatric:        Mood and Affect: Mood normal.        Behavior: Behavior normal.      Results for orders placed or performed in visit on 09/19/23  POCT CBG (Fasting - Glucose)  Result Value Ref Range   Glucose Fasting, POC  109 (A) 70 - 99 mg/dL    Recent Results (from the past 2160 hours)  POCT CBG (Fasting - Glucose)     Status: Abnormal   Collection Time: 09/19/23 10:20 AM  Result Value Ref Range   Glucose Fasting, POC 109 (A) 70 - 99 mg/dL      Assessment & Plan:  Continue  current medications.  Patient needs an eye exam.  Check labs.  Schedule carotid Dopplers and echocardiogram.  EKG is unremarkable. Problem List Items Addressed This Visit     Type 2 diabetes mellitus (HCC)   Relevant Orders   POCT CBG (Fasting - Glucose) (Completed)   Ambulatory referral to Ophthalmology   Coarse tremors   Relevant Medications   primidone (MYSOLINE) 50 MG tablet   Hypertension associated with diabetes (HCC) - Primary   Relevant Orders   CMP14+EGFR   US  Carotid Duplex Bilateral   Other Visit Diagnoses       Combined hyperlipidemia associated with type 2 diabetes mellitus (HCC)       Relevant Orders   Lipid Panel w/o Chol/HDL Ratio   US  Carotid Duplex Bilateral     Dizziness       Relevant Orders   CBC with Diff   EKG 12-Lead (Completed)   US  Carotid Duplex Bilateral   PCV ECHOCARDIOGRAM COMPLETE     Hyperuricemia       Relevant Orders   Uric acid     Nonrheumatic aortic valve insufficiency       Relevant Orders   PCV ECHOCARDIOGRAM COMPLETE       Return in about 2 weeks (around 10/03/2023).   Total time spent: 30 minutes  Aisha Hove, MD  09/19/2023   This document may have been prepared by [facility] Voice Recognition software and as such may include unintentional dictation errors.

## 2023-09-21 LAB — CBC WITH DIFFERENTIAL/PLATELET
Basophils Absolute: 0.1 10*3/uL (ref 0.0–0.2)
Basos: 1 %
EOS (ABSOLUTE): 0.1 10*3/uL (ref 0.0–0.4)
Eos: 2 %
Hematocrit: 41.2 % (ref 34.0–46.6)
Hemoglobin: 12.9 g/dL (ref 11.1–15.9)
Immature Grans (Abs): 0 10*3/uL (ref 0.0–0.1)
Immature Granulocytes: 0 %
Lymphocytes Absolute: 4.1 10*3/uL — ABNORMAL HIGH (ref 0.7–3.1)
Lymphs: 51 %
MCH: 26.9 pg (ref 26.6–33.0)
MCHC: 31.3 g/dL — ABNORMAL LOW (ref 31.5–35.7)
MCV: 86 fL (ref 79–97)
Monocytes Absolute: 0.6 10*3/uL (ref 0.1–0.9)
Monocytes: 7 %
Neutrophils Absolute: 3.1 10*3/uL (ref 1.4–7.0)
Neutrophils: 39 %
Platelets: 367 10*3/uL (ref 150–450)
RBC: 4.79 x10E6/uL (ref 3.77–5.28)
RDW: 15.4 % (ref 11.7–15.4)
WBC: 8.1 10*3/uL (ref 3.4–10.8)

## 2023-09-21 LAB — CMP14+EGFR

## 2023-09-21 LAB — LIPID PANEL W/O CHOL/HDL RATIO

## 2023-09-21 LAB — URIC ACID

## 2023-10-05 ENCOUNTER — Ambulatory Visit: Payer: Medicare (Managed Care) | Admitting: Internal Medicine

## 2023-10-05 ENCOUNTER — Ambulatory Visit: Payer: Self-pay | Admitting: Internal Medicine

## 2023-10-05 ENCOUNTER — Other Ambulatory Visit: Payer: Self-pay | Admitting: Internal Medicine

## 2023-10-05 ENCOUNTER — Encounter: Payer: Self-pay | Admitting: Internal Medicine

## 2023-10-05 VITALS — BP 144/80 | HR 82 | Ht 64.0 in | Wt 195.0 lb

## 2023-10-05 DIAGNOSIS — E1159 Type 2 diabetes mellitus with other circulatory complications: Secondary | ICD-10-CM | POA: Diagnosis not present

## 2023-10-05 DIAGNOSIS — E1169 Type 2 diabetes mellitus with other specified complication: Secondary | ICD-10-CM

## 2023-10-05 DIAGNOSIS — I152 Hypertension secondary to endocrine disorders: Secondary | ICD-10-CM | POA: Diagnosis not present

## 2023-10-05 DIAGNOSIS — Z794 Long term (current) use of insulin: Secondary | ICD-10-CM

## 2023-10-05 DIAGNOSIS — E782 Mixed hyperlipidemia: Secondary | ICD-10-CM | POA: Diagnosis not present

## 2023-10-05 DIAGNOSIS — E119 Type 2 diabetes mellitus without complications: Secondary | ICD-10-CM

## 2023-10-05 DIAGNOSIS — E790 Hyperuricemia without signs of inflammatory arthritis and tophaceous disease: Secondary | ICD-10-CM | POA: Diagnosis not present

## 2023-10-05 DIAGNOSIS — R42 Dizziness and giddiness: Secondary | ICD-10-CM

## 2023-10-05 LAB — POCT CBG (FASTING - GLUCOSE)-MANUAL ENTRY: Glucose Fasting, POC: 149 mg/dL — AB (ref 70–99)

## 2023-10-05 MED ORDER — SEMAGLUTIDE (1 MG/DOSE) 4 MG/3ML ~~LOC~~ SOPN
1.0000 mg | PEN_INJECTOR | SUBCUTANEOUS | 3 refills | Status: DC
Start: 2023-10-05 — End: 2024-01-04

## 2023-10-05 NOTE — Progress Notes (Signed)
 Established Patient Office Visit  Subjective:  Patient ID: Lauren Mueller, female    DOB: 1955-05-26  Age: 68 y.o. MRN: [id number]  Chief Complaint  Patient presents with   Follow-up    2 week follow up    Patient comes in for follow-up today.  At her last visit she had complained of intermittent dizziness.  Order Doppler and echocardiogram were scheduled but still not completed yet.  Also lab was not able to collect enough blood specimens and most of the tests were not done.  Will approach that again today.  Patient denies chest pain and no cough as of breath but still feels intermittent lightheadedness.  Her blood pressure is a little better today.    No other concerns at this time.   Past Medical History:  Diagnosis Date   Anxiety    Arthritis    Depression    Diabetes mellitus, type 2 (HCC) 01/09/2014   DKA, type 2 (HCC) 09/14/2016   Hyperlipidemia associated with type 2 diabetes mellitus (HCC)    Hypertension associated with diabetes (HCC)    Type 2 diabetes mellitus treated with insulin  [facility])     Past Surgical History:  Procedure Laterality Date   ABDOMINAL HYSTERECTOMY     CESAREAN SECTION     x3   HERNIA REPAIR      Social History   Socioeconomic History   Marital status: Married    Spouse name: Not on file   Number of children: 5   Years of education: 13   Highest education level: Some college, no degree  Occupational History   Occupation: unemployed  Tobacco Use   Smoking status: Never   Smokeless tobacco: Never  Vaping Use   Vaping status: Never Used  Substance and Sexual Activity   Alcohol use: No    Alcohol/week: 0.0 standard drinks of alcohol   Drug use: No   Sexual activity: Not on file  Other Topics Concern   Not on file  Social History Narrative   PHQ 9 administered to patient score of 12. At this time medication is not recommended and follow up scheduled with Powell Pesa LCSW scheduled on January 3rd at 10:30 am.       GAD 7  administered to the patient score of 11. Follow up for therapy in two weeks.       Social Determinants tool administered with the patient: finances are a strain. Patient has applied for disability and asked about SSI. Follow up in two weeks, resources for SSI check list to be provided along with options for the application process.    Social Drivers of Corporate investment banker Strain: Low Risk  (08/24/2023)   Received from [facility]   Overall Financial Resource Strain (CARDIA)    Difficulty of Paying Living Expenses: Not hard at all  Food Insecurity: No Food Insecurity (08/24/2023)   Received from [facility]   Hunger Vital Sign    Within the past 12 months, you worried that your food would run out before you got the money to buy more.: Never true    Within the past 12 months, the food you bought just didn't last and you didn't have money to get more.: Never true  Transportation Needs: No Transportation Needs (08/24/2023)   Received from [facility] - Transportation    In the past 12 months, has lack of transportation kept you from medical appointments or from getting  medications?: No    Lack of Transportation (Non-Medical): No  Physical Activity: Sufficiently Active (03/23/2017)   Exercise Vital Sign    Days of Exercise per Week: 7 days    Minutes of Exercise per Session: 40 min  Stress: Stress Concern Present (03/23/2017)   [employer] of Occupational Health - Occupational Stress Questionnaire    Feeling of Stress : Very much  Social Connections: Moderately Integrated (03/23/2017)   Social Connection and Isolation Panel    Frequency of Communication with Friends and Family: Never    Frequency of Social Gatherings with Friends and Family: Once a week    Attends Religious Services: More than 4 times per year    Active Member of [employer] or Organizations: Yes    Attends Engineer, structural: More than 4  times per year    Marital Status: Married  Catering manager Violence: Not At Risk (03/23/2017)   Humiliation, Afraid, Rape, and Kick questionnaire    Fear of Current or Ex-Partner: No    Emotionally Abused: No    Physically Abused: No    Sexually Abused: No    Family History  Problem Relation Age of Onset   Hypertension Mother    Hypertension Sister    Breast cancer Neg Hx     No Known Allergies  Outpatient Medications Prior to Visit  Medication Sig   acetaminophen  (TYLENOL ) 500 MG tablet Take 500 mg by mouth every 6 (six) hours as needed.   allopurinol (ZYLOPRIM) 100 MG tablet TAKE ONE TABLET BY MOUTH TWICE A DAY   amLODipine  (NORVASC ) 10 MG tablet TAKE ONE TABLET BY MOUTH ONE TIME DAILY   atorvastatin  (LIPITOR) 40 MG tablet TAKE ONE TABLET BY MOUTH ONE TIME DAILY   blood glucose meter kit and supplies KIT Dispense based on patient and insurance preference. Use up to four times daily as directed. (FOR ICD-9 250.00, 250.01).   carvedilol  (COREG ) 25 MG tablet TAKE ONE TABLET BY MOUTH 2 TIMES A DAY WITH A MEAL.   Cholecalciferol (VITAMIN D3) 1.25 MG (50000 UT) CAPS TAKE ONE CAPSULE BY MOUTH EVERY WEEK   colchicine  0.6 MG tablet TAKE ONE TABLET BY MOUTH TWO TIMES DAILY   Continuous Glucose Sensor (DEXCOM G7 SENSOR) MISC Inject 1 each into the muscle once. Every 10 days   lisinopril -hydrochlorothiazide  (ZESTORETIC) 20-12.5 MG tablet TAKE TWO TABLETS BY MOUTH EVERY MORNING FOR BLOOD PRESSURE   metFORMIN  (GLUCOPHAGE ) 1000 MG tablet Take 1 tablet (1,000 mg total) by mouth 2 (two) times daily with a meal.   methylPREDNISolone  (MEDROL  DOSEPAK) 4 MG TBPK tablet USE AS DIRECTED ON THE PACK   primidone  (MYSOLINE ) 50 MG tablet Take 1 tablet (50 mg total) by mouth at bedtime.   TRESIBA FLEXTOUCH 200 UNIT/ML FlexTouch Pen Inject 50 mLs into the skin daily.   ULTICARE PEN NEEDLES 31G X 5 MM MISC USE WITH NOVOLOG  PEN TWICE A DAY   [DISCONTINUED] Semaglutide,0.25 or 0.5MG /DOS, 2 MG/3ML SOPN Inject  0.5 mLs into the skin every 7 (seven) days.   insulin  lispro (HUMALOG ) 100 UNIT/ML injection PLEASE USE WITH SLIDING SCALE TWICE A DAY. (MAX DOSE OF 12 UNITS FOR BLOOD SUGAR BETWEEN 301-350) (Patient not taking: Reported on 10/05/2023)   pregabalin (LYRICA) 25 MG capsule Take 25 mg by mouth daily. (Patient not taking: Reported on 10/05/2023)   No facility-administered medications prior to visit.    Review of Systems  Constitutional: Negative.  Negative for chills, fever and weight loss.  HENT: Negative.  Negative for sore  throat.   Eyes: Negative.   Respiratory: Negative.  Negative for cough and shortness of breath.   Cardiovascular: Negative.  Negative for chest pain, palpitations and leg swelling.  Gastrointestinal: Negative.  Negative for abdominal pain, constipation, diarrhea, heartburn, nausea and vomiting.  Genitourinary: Negative.  Negative for dysuria and flank pain.  Musculoskeletal: Negative.  Negative for joint pain and myalgias.  Skin: Negative.   Neurological:  Positive for dizziness. Negative for tingling, tremors and headaches.  Endo/Heme/Allergies: Negative.   Psychiatric/Behavioral: Negative.  Negative for depression and suicidal ideas. The patient is not nervous/anxious.        Objective:   BP (!) 144/80   Pulse 82   Ht 5' 4 (1.626 m)   Wt 195 lb (88.5 kg)   SpO2 98%   BMI 33.47 kg/m   Vitals:   10/05/23 0919  BP: (!) 144/80  Pulse: 82  Height: 5' 4 (1.626 m)  Weight: 195 lb (88.5 kg)  SpO2: 98%  BMI (Calculated): 33.46    Physical Exam Vitals and nursing note reviewed.  Constitutional:      Appearance: Normal appearance.  HENT:     Head: Normocephalic and atraumatic.     Nose: Nose normal.     Mouth/Throat:     Mouth: Mucous membranes are moist.     Pharynx: Oropharynx is clear.  Eyes:     Conjunctiva/sclera: Conjunctivae normal.     Pupils: Pupils are equal, round, and reactive to light.  Cardiovascular:     Rate and Rhythm: Normal rate and  regular rhythm.     Pulses: Normal pulses.     Heart sounds: Normal heart sounds. No murmur heard. Pulmonary:     Effort: Pulmonary effort is normal.     Breath sounds: Normal breath sounds. No wheezing.  Abdominal:     General: Bowel sounds are normal.     Palpations: Abdomen is soft.     Tenderness: There is no abdominal tenderness. There is no right CVA tenderness or left CVA tenderness.  Musculoskeletal:        General: Normal range of motion.     Cervical back: Normal range of motion.     Right lower leg: No edema.     Left lower leg: No edema.  Skin:    General: Skin is warm and dry.  Neurological:     General: No focal deficit present.     Mental Status: She is alert and oriented to person, place, and time.  Psychiatric:        Mood and Affect: Mood normal.        Behavior: Behavior normal.      Results for orders placed or performed in visit on 10/05/23  POCT CBG (Fasting - Glucose)  Result Value Ref Range   Glucose Fasting, POC 149 (A) 70 - 99 mg/dL    Recent Results (from the past 2160 hours)  POCT CBG (Fasting - Glucose)     Status: Abnormal   Collection Time: 09/19/23 10:20 AM  Result Value Ref Range   Glucose Fasting, POC 109 (A) 70 - 99 mg/dL  CBC with Diff     Status: Abnormal   Collection Time: 09/19/23 11:19 AM  Result Value Ref Range   WBC 8.1 3.4 - 10.8 x10E3/uL   RBC 4.79 3.77 - 5.28 x10E6/uL   Hemoglobin 12.9 11.1 - 15.9 g/dL   Hematocrit 58.7 65.9 - 46.6 %   MCV 86 79 - 97 fL   MCH 26.9 26.6 -  33.0 pg   MCHC 31.3 (L) 31.5 - 35.7 g/dL   RDW 84.5 88.2 - 84.5 %   Platelets 367 150 - 450 x10E3/uL   Neutrophils 39 Not Estab. %   Lymphs 51 Not Estab. %   Monocytes 7 Not Estab. %   Eos 2 Not Estab. %   Basos 1 Not Estab. %   Neutrophils Absolute 3.1 1.4 - 7.0 x10E3/uL   Lymphocytes Absolute 4.1 (H) 0.7 - 3.1 x10E3/uL   Monocytes Absolute 0.6 0.1 - 0.9 x10E3/uL   EOS (ABSOLUTE) 0.1 0.0 - 0.4 x10E3/uL   Basophils Absolute 0.1 0.0 - 0.2 x10E3/uL    Immature Granulocytes 0 Not Estab. %   Immature Grans (Abs) 0.0 0.0 - 0.1 x10E3/uL  CMP14+EGFR     Status: None   Collection Time: 09/19/23 11:19 AM  Result Value Ref Range   Glucose CANCELED mg/dL    Comment: [facility] was unable to collect sufficient specimen to perform the following test(s), and is providing the patient with re-collection instructions.  Result canceled by the ancillary.    BUN CANCELED mg/dL    Comment: [facility] was unable to collect sufficient specimen to perform the following test(s), and is providing the patient with re-collection instructions.  Result canceled by the ancillary.    Creatinine, Ser CANCELED mg/dL    Comment: [facility] was unable to collect sufficient specimen to perform the following test(s), and is providing the patient with re-collection instructions.  Result canceled by the ancillary.    BUN/Creatinine Ratio CANCELED     Comment: Unable to calculate result since non-numeric result obtained for component test.  Result canceled by the ancillary.    Sodium CANCELED mmol/L    Comment: [facility] was unable to collect sufficient specimen to perform the following test(s), and is providing the patient with re-collection instructions.  Result canceled by the ancillary.    Potassium CANCELED mmol/L    Comment: [facility] was unable to collect sufficient specimen to perform the following test(s), and is providing the patient with re-collection instructions.  Result canceled by the ancillary.    Chloride CANCELED mmol/L    Comment: [facility] was unable to collect sufficient specimen to perform the following test(s), and is providing the patient with re-collection instructions.  Result canceled by the ancillary.    CO2 CANCELED mmol/L    Comment: [facility] was unable to collect sufficient specimen to perform the following test(s), and is providing the patient with re-collection instructions.  Result canceled by the ancillary.    Calcium   CANCELED mg/dL    Comment: [facility] was unable to collect sufficient specimen to perform the following test(s), and is providing the patient with re-collection instructions.  Result canceled by the ancillary.    Total Protein CANCELED g/dL    Comment: [facility] was unable to collect sufficient specimen to perform the following test(s), and is providing the patient with re-collection instructions.  Result canceled by the ancillary.    Albumin CANCELED g/dL    Comment: [facility] was unable to collect sufficient specimen to perform the following test(s), and is providing the patient with re-collection instructions.  Result canceled by the ancillary.    Globulin, Total CANCELED g/dL    Comment: Unable to calculate result since non-numeric result obtained for component test.  Result canceled by the ancillary.    Bilirubin Total CANCELED mg/dL    Comment: [facility] was unable to collect sufficient specimen to perform the following test(s), and is providing the patient with re-collection instructions.  Result canceled by  the ancillary.    Alkaline Phosphatase CANCELED IU/L    Comment: [facility] was unable to collect sufficient specimen to perform the following test(s), and is providing the patient with re-collection instructions.  Result canceled by the ancillary.    AST CANCELED IU/L    Comment: [facility] was unable to collect sufficient specimen to perform the following test(s), and is providing the patient with re-collection instructions.  Result canceled by the ancillary.    ALT CANCELED IU/L    Comment: [facility] was unable to collect sufficient specimen to perform the following test(s), and is providing the patient with re-collection instructions.  Result canceled by the ancillary.   Lipid Panel w/o Chol/HDL Ratio     Status: None   Collection Time: 09/19/23 11:19 AM  Result Value Ref Range   Cholesterol, Total CANCELED mg/dL    Comment: [facility] was unable to collect  sufficient specimen to perform the following test(s), and is providing the patient with re-collection instructions.  Result canceled by the ancillary.    Triglycerides CANCELED mg/dL    Comment: [facility] was unable to collect sufficient specimen to perform the following test(s), and is providing the patient with re-collection instructions.  Result canceled by the ancillary.    HDL CANCELED mg/dL    Comment: [facility] was unable to collect sufficient specimen to perform the following test(s), and is providing the patient with re-collection instructions.  Result canceled by the ancillary.    VLDL Cholesterol Cal CANCELED mg/dL    Comment: Unable to calculate result since non-numeric result obtained for component test.  Result canceled by the ancillary.    LDL Chol Calc (NIH) CANCELED mg/dL    Comment: Unable to calculate result since non-numeric result obtained for component test.  Result canceled by the ancillary.    LDL CALC COMMENT: CANCELED     Comment: Unable to calculate result since non-numeric result obtained for component test.  Result canceled by the ancillary.   Uric acid     Status: None   Collection Time: 09/19/23 11:19 AM  Result Value Ref Range   Uric Acid CANCELED mg/dL    Comment: [facility] was unable to collect sufficient specimen to perform the following test(s), and is providing the patient with re-collection instructions.            Therapeutic target for gout patients: <6.0  Result canceled by the ancillary.   POCT CBG (Fasting - Glucose)     Status: Abnormal   Collection Time: 10/05/23  9:22 AM  Result Value Ref Range   Glucose Fasting, POC 149 (A) 70 - 99 mg/dL      Assessment & Plan:  Continue current medications.  Ozempic dose increased to 1 mg/week.  Patient will get her carotid Dopplers and echocardiogram.  Will return to discuss results. Problem List Items Addressed This Visit     Type 2 diabetes mellitus (HCC)   Relevant Medications    Semaglutide, 1 MG/DOSE, 4 MG/3ML SOPN   Other Relevant Orders   POCT CBG (Fasting - Glucose) (Completed)   Hypertension associated with diabetes (HCC) - Primary   Relevant Medications   Semaglutide, 1 MG/DOSE, 4 MG/3ML SOPN   Other Visit Diagnoses       Combined hyperlipidemia associated with type 2 diabetes mellitus (HCC)       Relevant Medications   Semaglutide, 1 MG/DOSE, 4 MG/3ML SOPN     Dizziness         Hyperuricemia  Return in about 2 weeks (around 10/19/2023).   Total time spent: 25 minutes  FERNAND FREDY RAMAN, MD  10/05/2023   This document may have been prepared by [facility] Voice Recognition software and as such may include unintentional dictation errors.

## 2023-10-06 LAB — CMP14+EGFR
ALT: 17 IU/L (ref 0–32)
AST: 16 IU/L (ref 0–40)
Albumin: 4.3 g/dL (ref 3.9–4.9)
Alkaline Phosphatase: 115 IU/L (ref 44–121)
BUN/Creatinine Ratio: 17 (ref 12–28)
BUN: 19 mg/dL (ref 8–27)
Bilirubin Total: 0.3 mg/dL (ref 0.0–1.2)
CO2: 24 mmol/L (ref 20–29)
Calcium: 9.7 mg/dL (ref 8.7–10.3)
Chloride: 102 mmol/L (ref 96–106)
Creatinine, Ser: 1.12 mg/dL — ABNORMAL HIGH (ref 0.57–1.00)
Globulin, Total: 2.8 g/dL (ref 1.5–4.5)
Glucose: 129 mg/dL — ABNORMAL HIGH (ref 70–99)
Potassium: 3.9 mmol/L (ref 3.5–5.2)
Sodium: 141 mmol/L (ref 134–144)
Total Protein: 7.1 g/dL (ref 6.0–8.5)
eGFR: 54 mL/min/1.73 — ABNORMAL LOW (ref >59)

## 2023-10-06 LAB — LIPID PANEL W/O CHOL/HDL RATIO
Cholesterol, Total: 153 mg/dL (ref 100–199)
HDL: 48 mg/dL (ref >39)
LDL Chol Calc (NIH): 76 mg/dL (ref 0–99)
Triglycerides: 172 mg/dL — ABNORMAL HIGH (ref 0–149)
VLDL Cholesterol Cal: 29 mg/dL (ref 5–40)

## 2023-10-06 LAB — URIC ACID: Uric Acid: 7.3 mg/dL — ABNORMAL HIGH (ref 3.0–7.2)

## 2023-10-09 ENCOUNTER — Other Ambulatory Visit: Payer: Self-pay | Admitting: Internal Medicine

## 2023-10-09 ENCOUNTER — Ambulatory Visit: Payer: Self-pay | Admitting: Internal Medicine

## 2023-10-09 ENCOUNTER — Ambulatory Visit: Payer: Medicare (Managed Care)

## 2023-10-09 DIAGNOSIS — M1A09X0 Idiopathic chronic gout, multiple sites, without tophus (tophi): Secondary | ICD-10-CM

## 2023-10-09 MED ORDER — POLYMYXIN B-TRIMETHOPRIM 10000-0.1 UNIT/ML-% OP SOLN: 2.0000 [drp] | Freq: Four times a day (QID) | OPHTHALMIC | 0 refills | Status: AC

## 2023-10-09 MED ORDER — ALLOPURINOL 300 MG PO TABS: 300.0000 mg | ORAL_TABLET | Freq: Every day | ORAL | 2 refills | Status: AC

## 2023-10-09 NOTE — Progress Notes (Signed)
 Pt informed. Pt states she is still taking the 100 mg of allopurinol . Pt was supposed to go in today for a test at the hospital but had to reschedule it for the end of the month. Pt is wondering if she should reschedule her appointment that is scheduled for 7/11 due to this or not.

## 2023-10-10 ENCOUNTER — Ambulatory Visit: Payer: Self-pay

## 2023-10-10 NOTE — Progress Notes (Signed)
Pt informed

## 2023-10-13 ENCOUNTER — Ambulatory Visit (INDEPENDENT_AMBULATORY_CARE_PROVIDER_SITE_OTHER): Payer: Medicare (Managed Care)

## 2023-10-13 DIAGNOSIS — R42 Dizziness and giddiness: Secondary | ICD-10-CM

## 2023-10-13 DIAGNOSIS — I361 Nonrheumatic tricuspid (valve) insufficiency: Secondary | ICD-10-CM

## 2023-10-13 DIAGNOSIS — I351 Nonrheumatic aortic (valve) insufficiency: Secondary | ICD-10-CM | POA: Diagnosis not present

## 2023-10-13 DIAGNOSIS — I340 Nonrheumatic mitral (valve) insufficiency: Secondary | ICD-10-CM

## 2023-10-23 ENCOUNTER — Other Ambulatory Visit: Payer: Self-pay | Admitting: Internal Medicine

## 2023-10-23 DIAGNOSIS — M1000 Idiopathic gout, unspecified site: Secondary | ICD-10-CM

## 2023-10-24 ENCOUNTER — Ambulatory Visit (INDEPENDENT_AMBULATORY_CARE_PROVIDER_SITE_OTHER): Payer: Medicare (Managed Care) | Admitting: Internal Medicine

## 2023-10-24 ENCOUNTER — Encounter: Payer: Self-pay | Admitting: Internal Medicine

## 2023-10-24 ENCOUNTER — Other Ambulatory Visit: Payer: Self-pay | Admitting: Internal Medicine

## 2023-10-24 ENCOUNTER — Ambulatory Visit: Payer: Self-pay | Admitting: Internal Medicine

## 2023-10-24 VITALS — BP 130/78 | HR 82 | Ht 64.0 in | Wt 194.6 lb

## 2023-10-24 DIAGNOSIS — E1159 Type 2 diabetes mellitus with other circulatory complications: Secondary | ICD-10-CM | POA: Diagnosis not present

## 2023-10-24 DIAGNOSIS — E1169 Type 2 diabetes mellitus with other specified complication: Secondary | ICD-10-CM

## 2023-10-24 DIAGNOSIS — R519 Headache, unspecified: Secondary | ICD-10-CM

## 2023-10-24 DIAGNOSIS — R42 Dizziness and giddiness: Secondary | ICD-10-CM

## 2023-10-24 DIAGNOSIS — I152 Hypertension secondary to endocrine disorders: Secondary | ICD-10-CM

## 2023-10-24 DIAGNOSIS — M1A09X0 Idiopathic chronic gout, multiple sites, without tophus (tophi): Secondary | ICD-10-CM | POA: Diagnosis not present

## 2023-10-24 DIAGNOSIS — Z794 Long term (current) use of insulin: Secondary | ICD-10-CM

## 2023-10-24 DIAGNOSIS — E782 Mixed hyperlipidemia: Secondary | ICD-10-CM

## 2023-10-24 LAB — POC CREATINE & ALBUMIN,URINE
Creatinine, POC: 300 mg/dL
Microalbumin Ur, POC: 80 mg/L

## 2023-10-24 LAB — POCT CBG (FASTING - GLUCOSE)-MANUAL ENTRY: Glucose Fasting, POC: 103 mg/dL — AB (ref 70–99)

## 2023-10-24 NOTE — Progress Notes (Signed)
 Established Patient Office Visit  Subjective:  Patient ID: Lauren Mueller, female    DOB: [birth year]  Age: 68 y.o. MRN: [id number]  Chief Complaint  Patient presents with   Follow-up    2 week follow up    Patient comes in for her follow-up today.  Also her dizziness has eased up but now she is having bilateral headaches behind her eyes.  The headaches persist throughout the day, ease up a little when she takes Tylenol  and then comes right back.  This has been happening for the last 2 weeks.  No neck pain.  Denies any nasal congestion or discharge and no postnasal drip.  She gets regular eye exam and and reports that she does not need corrective lenses at this time.  Her echocardiogram was done and results were discussed today.  She could not get carotid Dopplers due to expense.  Denies any tingling or numbness, no nausea or vomiting.    No other concerns at this time.   Past Medical History:  Diagnosis Date   Anxiety    Arthritis    Depression    Diabetes mellitus, type 2 (HCC) 01/09/2014   DKA, type 2 (HCC) 09/14/2016   Hyperlipidemia associated with type 2 diabetes mellitus (HCC)    Hypertension associated with diabetes (HCC)    Type 2 diabetes mellitus treated with insulin  (HCC)     Past Surgical History:  Procedure Laterality Date   ABDOMINAL HYSTERECTOMY     CESAREAN SECTION     x3   HERNIA REPAIR      Social History   Socioeconomic History   Marital status: Married    Spouse name: Not on file   Number of children: 5   Years of education: 13   Highest education level: Some college, no degree  Occupational History   Occupation: unemployed  Tobacco Use   Smoking status: Never   Smokeless tobacco: Never  Vaping Use   Vaping status: Never Used  Substance and Sexual Activity   Alcohol use: No    Alcohol/week: 0.0 standard drinks of alcohol   Drug use: No   Sexual activity: Not on file  Other Topics Concern   Not on file  Social History Narrative   PHQ 9  administered to patient score of 12. At this time medication is not recommended and follow up scheduled with Powell Pesa LCSW scheduled on January 3rd at 10:30 am.       GAD 7 administered to the patient score of 11. Follow up for therapy in two weeks.       Social Determinants tool administered with the patient: finances are a strain. Patient has applied for disability and asked about SSI. Follow up in two weeks, resources for SSI check list to be provided along with options for the application process.    Social Drivers of Corporate investment banker Strain: Low Risk  (08/24/2023)   Received from [facility]   Overall Financial Resource Strain (CARDIA)    Difficulty of Paying Living Expenses: Not hard at all  Food Insecurity: No Food Insecurity (08/24/2023)   Received from [facility]   Hunger Vital Sign    Within the past 12 months, you worried that your food would run out before you got the money to buy more.: Never true    Within the past 12 months, the food you bought just didn't last and you didn't have money to get more.: Never true  Transportation Needs: No Transportation Needs (08/24/2023)   Received from [facility] - Transportation    In the past 12 months, has lack of transportation kept you from medical appointments or from getting medications?: No    Lack of Transportation (Non-Medical): No  Physical Activity: Sufficiently Active (03/23/2017)   Exercise Vital Sign    Days of Exercise per Week: 7 days    Minutes of Exercise per Session: 40 min  Stress: Stress Concern Present (03/23/2017)   [employer] of Occupational Health - Occupational Stress Questionnaire    Feeling of Stress : Very much  Social Connections: Moderately Integrated (03/23/2017)   Social Connection and Isolation Panel    Frequency of Communication with Friends and Family: Never    Frequency of Social Gatherings with Friends and  Family: Once a week    Attends Religious Services: More than 4 times per year    Active Member of [employer] or Organizations: Yes    Attends Engineer, structural: More than 4 times per year    Marital Status: Married  Catering manager Violence: Not At Risk (03/23/2017)   Humiliation, Afraid, Rape, and Kick questionnaire    Fear of Current or Ex-Partner: No    Emotionally Abused: No    Physically Abused: No    Sexually Abused: No    Family History  Problem Relation Age of Onset   Hypertension Mother    Hypertension Sister    Breast cancer Neg Hx     No Known Allergies  Outpatient Medications Prior to Visit  Medication Sig   acetaminophen  (TYLENOL ) 500 MG tablet Take 500 mg by mouth every 6 (six) hours as needed.   allopurinol  (ZYLOPRIM ) 300 MG tablet Take 1 tablet (300 mg total) by mouth daily.   amLODipine  (NORVASC ) 10 MG tablet TAKE ONE TABLET BY MOUTH ONE TIME DAILY   atorvastatin  (LIPITOR) 40 MG tablet TAKE ONE TABLET BY MOUTH ONE TIME DAILY   blood glucose meter kit and supplies KIT Dispense based on patient and insurance preference. Use up to four times daily as directed. (FOR ICD-9 250.00, 250.01).   carvedilol  (COREG ) 25 MG tablet TAKE ONE TABLET BY MOUTH 2 TIMES A DAY WITH A MEAL.   Cholecalciferol (VITAMIN D3) 1.25 MG (50000 UT) CAPS TAKE ONE CAPSULE BY MOUTH EVERY WEEK   colchicine  0.6 MG tablet TAKE ONE TABLET BY MOUTH TWICE A DAY   Continuous Glucose Sensor (DEXCOM G7 SENSOR) MISC Inject 1 each into the muscle once. Every 10 days   lisinopril -hydrochlorothiazide  (ZESTORETIC) 20-12.5 MG tablet TAKE TWO TABLETS BY MOUTH EVERY MORNING FOR BLOOD PRESSURE   metFORMIN  (GLUCOPHAGE ) 1000 MG tablet Take 1 tablet (1,000 mg total) by mouth 2 (two) times daily with a meal.   methylPREDNISolone  (MEDROL  DOSEPAK) 4 MG TBPK tablet USE AS DIRECTED ON THE PACK   primidone  (MYSOLINE ) 50 MG tablet Take 1 tablet (50 mg total) by mouth at bedtime.   Semaglutide , 1 MG/DOSE, 4 MG/3ML  SOPN Inject 1 mg into the skin once a week.   TRESIBA FLEXTOUCH 200 UNIT/ML FlexTouch Pen Inject 50 mLs into the skin daily.   trimethoprim -polymyxin b  (POLYTRIM ) ophthalmic solution Place 2 drops into both eyes every 6 (six) hours.   ULTICARE PEN NEEDLES 31G X 5 MM MISC USE WITH NOVOLOG  PEN TWICE A DAY   insulin  lispro (HUMALOG ) 100 UNIT/ML injection PLEASE USE WITH SLIDING SCALE TWICE A DAY. (MAX DOSE OF 12 UNITS FOR BLOOD SUGAR BETWEEN 301-350) (Patient not  taking: Reported on 10/24/2023)   pregabalin (LYRICA) 25 MG capsule Take 25 mg by mouth daily. (Patient not taking: Reported on 10/24/2023)   No facility-administered medications prior to visit.    Review of Systems  Constitutional: Negative.  Negative for chills, fever, malaise/fatigue and weight loss.  HENT:  Positive for sinus pain. Negative for congestion, ear pain and sore throat.   Eyes: Negative.   Respiratory: Negative.  Negative for cough and shortness of breath.   Cardiovascular: Negative.  Negative for chest pain, palpitations and leg swelling.  Gastrointestinal: Negative.  Negative for abdominal pain, constipation, diarrhea, heartburn, nausea and vomiting.  Genitourinary: Negative.  Negative for dysuria and flank pain.  Musculoskeletal: Negative.  Negative for joint pain and myalgias.  Skin: Negative.   Neurological: Negative.  Negative for dizziness, tingling, tremors and headaches.  Endo/Heme/Allergies: Negative.   Psychiatric/Behavioral: Negative.  Negative for depression and suicidal ideas. The patient is not nervous/anxious.        Objective:   BP 130/78   Pulse 82   Ht 5' 4 (1.626 m)   Wt 194 lb 9.6 oz (88.3 kg)   SpO2 98%   BMI 33.40 kg/m   Vitals:   10/24/23 0931  BP: 130/78  Pulse: 82  Height: 5' 4 (1.626 m)  Weight: 194 lb 9.6 oz (88.3 kg)  SpO2: 98%  BMI (Calculated): 33.39    Physical Exam Vitals and nursing note reviewed.  Constitutional:      Appearance: Normal appearance.  HENT:      Head: Normocephalic and atraumatic.     Nose: Nose normal.     Mouth/Throat:     Mouth: Mucous membranes are moist.     Pharynx: Oropharynx is clear.  Eyes:     Conjunctiva/sclera: Conjunctivae normal.     Pupils: Pupils are equal, round, and reactive to light.  Cardiovascular:     Rate and Rhythm: Normal rate and regular rhythm.     Pulses: Normal pulses.     Heart sounds: Normal heart sounds. No murmur heard. Pulmonary:     Effort: Pulmonary effort is normal.     Breath sounds: Normal breath sounds. No wheezing.  Abdominal:     General: Bowel sounds are normal.     Palpations: Abdomen is soft.     Tenderness: There is no abdominal tenderness. There is no right CVA tenderness or left CVA tenderness.  Musculoskeletal:        General: Normal range of motion.     Cervical back: Normal range of motion.     Right lower leg: No edema.     Left lower leg: No edema.  Skin:    General: Skin is warm and dry.  Neurological:     General: No focal deficit present.     Mental Status: She is alert and oriented to person, place, and time.  Psychiatric:        Mood and Affect: Mood normal.        Behavior: Behavior normal.      Results for orders placed or performed in visit on 10/24/23  POCT CBG (Fasting - Glucose)  Result Value Ref Range   Glucose Fasting, POC 103 (A) 70 - 99 mg/dL  POC CREATINE & ALBUMIN,URINE  Result Value Ref Range   Microalbumin Ur, POC 80 mg/L   Creatinine, POC 300 mg/dL   Albumin/Creatinine Ratio, Urine, POC 30-300     Recent Results (from the past 2160 hours)  POCT CBG (Fasting - Glucose)  Status: Abnormal   Collection Time: 09/19/23 10:20 AM  Result Value Ref Range   Glucose Fasting, POC 109 (A) 70 - 99 mg/dL  CBC with Diff     Status: Abnormal   Collection Time: 09/19/23 11:19 AM  Result Value Ref Range   WBC 8.1 3.4 - 10.8 x10E3/uL   RBC 4.79 3.77 - 5.28 x10E6/uL   Hemoglobin 12.9 11.1 - 15.9 g/dL   Hematocrit 58.7 65.9 - 46.6 %   MCV 86 79 -  97 fL   MCH 26.9 26.6 - 33.0 pg   MCHC 31.3 (L) 31.5 - 35.7 g/dL   RDW 84.5 88.2 - 84.5 %   Platelets 367 150 - 450 x10E3/uL   Neutrophils 39 Not Estab. %   Lymphs 51 Not Estab. %   Monocytes 7 Not Estab. %   Eos 2 Not Estab. %   Basos 1 Not Estab. %   Neutrophils Absolute 3.1 1.4 - 7.0 x10E3/uL   Lymphocytes Absolute 4.1 (H) 0.7 - 3.1 x10E3/uL   Monocytes Absolute 0.6 0.1 - 0.9 x10E3/uL   EOS (ABSOLUTE) 0.1 0.0 - 0.4 x10E3/uL   Basophils Absolute 0.1 0.0 - 0.2 x10E3/uL   Immature Granulocytes 0 Not Estab. %   Immature Grans (Abs) 0.0 0.0 - 0.1 x10E3/uL  CMP14+EGFR     Status: None   Collection Time: 09/19/23 11:19 AM  Result Value Ref Range   Glucose CANCELED mg/dL    Comment: [facility] was unable to collect sufficient specimen to perform the following test(s), and is providing the patient with re-collection instructions.  Result canceled by the ancillary.    BUN CANCELED mg/dL    Comment: [facility] was unable to collect sufficient specimen to perform the following test(s), and is providing the patient with re-collection instructions.  Result canceled by the ancillary.    Creatinine, Ser CANCELED mg/dL    Comment: [facility] was unable to collect sufficient specimen to perform the following test(s), and is providing the patient with re-collection instructions.  Result canceled by the ancillary.    BUN/Creatinine Ratio CANCELED     Comment: Unable to calculate result since non-numeric result obtained for component test.  Result canceled by the ancillary.    Sodium CANCELED mmol/L    Comment: [facility] was unable to collect sufficient specimen to perform the following test(s), and is providing the patient with re-collection instructions.  Result canceled by the ancillary.    Potassium CANCELED mmol/L    Comment: [facility] was unable to collect sufficient specimen to perform the following test(s), and is providing the patient with re-collection instructions.  Result  canceled by the ancillary.    Chloride CANCELED mmol/L    Comment: [facility] was unable to collect sufficient specimen to perform the following test(s), and is providing the patient with re-collection instructions.  Result canceled by the ancillary.    CO2 CANCELED mmol/L    Comment: [facility] was unable to collect sufficient specimen to perform the following test(s), and is providing the patient with re-collection instructions.  Result canceled by the ancillary.    Calcium  CANCELED mg/dL    Comment: [facility] was unable to collect sufficient specimen to perform the following test(s), and is providing the patient with re-collection instructions.  Result canceled by the ancillary.    Total Protein CANCELED g/dL    Comment: [facility] was unable to collect sufficient specimen to perform the following test(s), and is providing the patient with re-collection instructions.  Result canceled by the ancillary.    Albumin CANCELED g/dL  Comment: [facility] was unable to collect sufficient specimen to perform the following test(s), and is providing the patient with re-collection instructions.  Result canceled by the ancillary.    Globulin, Total CANCELED g/dL    Comment: Unable to calculate result since non-numeric result obtained for component test.  Result canceled by the ancillary.    Bilirubin Total CANCELED mg/dL    Comment: [facility] was unable to collect sufficient specimen to perform the following test(s), and is providing the patient with re-collection instructions.  Result canceled by the ancillary.    Alkaline Phosphatase CANCELED IU/L    Comment: [facility] was unable to collect sufficient specimen to perform the following test(s), and is providing the patient with re-collection instructions.  Result canceled by the ancillary.    AST CANCELED IU/L    Comment: [facility] was unable to collect sufficient specimen to perform the following test(s), and is providing the patient  with re-collection instructions.  Result canceled by the ancillary.    ALT CANCELED IU/L    Comment: [facility] was unable to collect sufficient specimen to perform the following test(s), and is providing the patient with re-collection instructions.  Result canceled by the ancillary.   Lipid Panel w/o Chol/HDL Ratio     Status: None   Collection Time: 09/19/23 11:19 AM  Result Value Ref Range   Cholesterol, Total CANCELED mg/dL    Comment: [facility] was unable to collect sufficient specimen to perform the following test(s), and is providing the patient with re-collection instructions.  Result canceled by the ancillary.    Triglycerides CANCELED mg/dL    Comment: [facility] was unable to collect sufficient specimen to perform the following test(s), and is providing the patient with re-collection instructions.  Result canceled by the ancillary.    HDL CANCELED mg/dL    Comment: [facility] was unable to collect sufficient specimen to perform the following test(s), and is providing the patient with re-collection instructions.  Result canceled by the ancillary.    VLDL Cholesterol Cal CANCELED mg/dL    Comment: Unable to calculate result since non-numeric result obtained for component test.  Result canceled by the ancillary.    LDL Chol Calc (NIH) CANCELED mg/dL    Comment: Unable to calculate result since non-numeric result obtained for component test.  Result canceled by the ancillary.    LDL CALC COMMENT: CANCELED     Comment: Unable to calculate result since non-numeric result obtained for component test.  Result canceled by the ancillary.   Uric acid     Status: None   Collection Time: 09/19/23 11:19 AM  Result Value Ref Range   Uric Acid CANCELED mg/dL    Comment: [facility] was unable to collect sufficient specimen to perform the following test(s), and is providing the patient with re-collection instructions.            Therapeutic target for gout patients:  <6.0  Result canceled by the ancillary.   POCT CBG (Fasting - Glucose)     Status: Abnormal   Collection Time: 10/05/23  9:22 AM  Result Value Ref Range   Glucose Fasting, POC 149 (A) 70 - 99 mg/dL  [id number]     Status: Abnormal   Collection Time: 10/05/23  9:59 AM  Result Value Ref Range   Glucose 129 (H) 70 - 99 mg/dL   BUN 19 8 - 27 mg/dL   Creatinine, Ser 8.87 (H) 0.57 - 1.00 mg/dL   eGFR 54 (L) >40 fO/fpw/8.26   BUN/Creatinine Ratio 17 12 - 28  Sodium 141 134 - 144 mmol/L   Potassium 3.9 3.5 - 5.2 mmol/L   Chloride 102 96 - 106 mmol/L   CO2 24 20 - 29 mmol/L   Calcium  9.7 8.7 - 10.3 mg/dL   Total Protein 7.1 6.0 - 8.5 g/dL   Albumin 4.3 3.9 - 4.9 g/dL   Globulin, Total 2.8 1.5 - 4.5 g/dL   Bilirubin Total 0.3 0.0 - 1.2 mg/dL   Alkaline Phosphatase 115 44 - 121 IU/L   AST 16 0 - 40 IU/L   ALT 17 0 - 32 IU/L  Lipid Panel w/o Chol/HDL Ratio     Status: Abnormal   Collection Time: 10/05/23  9:59 AM  Result Value Ref Range   Cholesterol, Total 153 100 - 199 mg/dL   Triglycerides 827 (H) 0 - 149 mg/dL   HDL 48 >60 mg/dL   VLDL Cholesterol Cal 29 5 - 40 mg/dL   LDL Chol Calc (NIH) 76 0 - 99 mg/dL  Uric acid     Status: Abnormal   Collection Time: 10/05/23  9:59 AM  Result Value Ref Range   Uric Acid 7.3 (H) 3.0 - 7.2 mg/dL    Comment:            Therapeutic target for gout patients: <6.0  POCT CBG (Fasting - Glucose)     Status: Abnormal   Collection Time: 10/24/23  9:34 AM  Result Value Ref Range   Glucose Fasting, POC 103 (A) 70 - 99 mg/dL  POC CREATINE & ALBUMIN,URINE     Status: Abnormal   Collection Time: 10/24/23 10:01 AM  Result Value Ref Range   Microalbumin Ur, POC 80 mg/L   Creatinine, POC 300 mg/dL   Albumin/Creatinine Ratio, Urine, POC 30-300       Assessment & Plan:  Will schedule CT of the head in light of headaches and dizziness.  Seems like patient is taking too much Tylenol  side effect.  Advised to cut down the use.  Will also check when she is  due for her next eye exam.  Meanwhile continue the rest of medications.  Will call her with the results. Problem List Items Addressed This Visit     Type 2 diabetes mellitus (HCC)   Relevant Orders   POCT CBG (Fasting - Glucose) (Completed)   POC CREATINE & ALBUMIN,URINE (Completed)   Chronic gout of multiple sites   Hypertension associated with diabetes (HCC)   Other Visit Diagnoses       Headache around the eyes    -  Primary   Relevant Orders   CT HEAD W & WO CONTRAST ( )     Dizziness       Relevant Orders   CT HEAD W & WO CONTRAST ( )     Combined hyperlipidemia associated with type 2 diabetes mellitus (HCC)           Return in about 2 months (around 12/25/2023).   Total time spent: 30 minutes  FERNAND FREDY RAMAN, MD  10/24/2023   This document may have been prepared by [facility] Voice Recognition software and as such may include unintentional dictation errors.

## 2023-10-30 ENCOUNTER — Ambulatory Visit: Admission: RE | Admit: 2023-10-30 | Payer: Medicare (Managed Care) | Source: Ambulatory Visit

## 2023-11-03 ENCOUNTER — Telehealth: Payer: Self-pay

## 2023-11-03 NOTE — Progress Notes (Signed)
   11/03/2023  Patient ID: Lauren Mueller, female   DOB: [birth year], 68 y.o.   MRN: [id number]  Pharmacy Quality Measure Review  This patient is appearing on a report for being at risk of failing the Glycemic Status Assessment in Diabetes measure this calendar year.   Last documented A1c or GMI 7.9 on 08/30/23  Attempted to contact patient to discuss recent med changes at endo appt and current glycemic control. Unable to leave voicemail, will continue to attempt to reach.  Jon VEAR Lindau, PharmD Clinical Pharmacist [phone]

## 2023-11-07 ENCOUNTER — Ambulatory Visit: Payer: Medicare (Managed Care) | Attending: Internal Medicine

## 2023-11-25 ENCOUNTER — Other Ambulatory Visit: Payer: Self-pay | Admitting: Internal Medicine

## 2023-11-25 DIAGNOSIS — E782 Mixed hyperlipidemia: Secondary | ICD-10-CM

## 2023-12-04 ENCOUNTER — Other Ambulatory Visit: Payer: Self-pay | Admitting: Internal Medicine

## 2023-12-13 ENCOUNTER — Other Ambulatory Visit: Payer: Self-pay | Admitting: Family

## 2023-12-13 DIAGNOSIS — I10 Essential (primary) hypertension: Secondary | ICD-10-CM

## 2023-12-26 ENCOUNTER — Ambulatory Visit: Payer: Medicare (Managed Care) | Admitting: Internal Medicine

## 2024-01-04 ENCOUNTER — Other Ambulatory Visit: Payer: Self-pay | Admitting: Family

## 2024-01-04 ENCOUNTER — Other Ambulatory Visit: Payer: Self-pay | Admitting: Internal Medicine

## 2024-01-04 DIAGNOSIS — M1000 Idiopathic gout, unspecified site: Secondary | ICD-10-CM

## 2024-01-04 DIAGNOSIS — E119 Type 2 diabetes mellitus without complications: Secondary | ICD-10-CM

## 2024-01-10 DIAGNOSIS — E113293 Type 2 diabetes mellitus with mild nonproliferative diabetic retinopathy without macular edema, bilateral: Secondary | ICD-10-CM | POA: Diagnosis not present

## 2024-01-10 DIAGNOSIS — I10 Essential (primary) hypertension: Secondary | ICD-10-CM | POA: Diagnosis not present

## 2024-01-10 DIAGNOSIS — E785 Hyperlipidemia, unspecified: Secondary | ICD-10-CM | POA: Diagnosis not present

## 2024-01-10 DIAGNOSIS — E66811 Obesity, class 1: Secondary | ICD-10-CM | POA: Diagnosis not present

## 2024-01-10 DIAGNOSIS — N1831 Chronic kidney disease, stage 3a: Secondary | ICD-10-CM | POA: Diagnosis not present

## 2024-01-10 DIAGNOSIS — E1169 Type 2 diabetes mellitus with other specified complication: Secondary | ICD-10-CM | POA: Diagnosis not present

## 2024-01-10 DIAGNOSIS — E1122 Type 2 diabetes mellitus with diabetic chronic kidney disease: Secondary | ICD-10-CM | POA: Diagnosis not present

## 2024-01-10 DIAGNOSIS — Z794 Long term (current) use of insulin: Secondary | ICD-10-CM | POA: Diagnosis not present

## 2024-01-19 ENCOUNTER — Other Ambulatory Visit: Payer: Self-pay | Admitting: Internal Medicine

## 2024-02-21 ENCOUNTER — Other Ambulatory Visit: Payer: Self-pay | Admitting: Internal Medicine

## 2024-03-14 ENCOUNTER — Other Ambulatory Visit: Payer: Self-pay | Admitting: Internal Medicine

## 2024-03-14 DIAGNOSIS — M1000 Idiopathic gout, unspecified site: Secondary | ICD-10-CM

## 2024-04-12 ENCOUNTER — Encounter: Payer: Self-pay | Admitting: Internal Medicine

## 2024-04-12 ENCOUNTER — Ambulatory Visit
Admission: RE | Admit: 2024-04-12 | Discharge: 2024-04-12 | Disposition: A | Payer: Medicare (Managed Care) | Attending: Internal Medicine | Admitting: Internal Medicine

## 2024-04-12 ENCOUNTER — Ambulatory Visit: Payer: Medicare (Managed Care) | Admitting: Internal Medicine

## 2024-04-12 VITALS — BP 128/79 | HR 82 | Ht 64.0 in | Wt 177.0 lb

## 2024-04-12 DIAGNOSIS — M25552 Pain in left hip: Secondary | ICD-10-CM | POA: Diagnosis not present

## 2024-04-12 DIAGNOSIS — E782 Mixed hyperlipidemia: Secondary | ICD-10-CM | POA: Diagnosis not present

## 2024-04-12 DIAGNOSIS — E1165 Type 2 diabetes mellitus with hyperglycemia: Secondary | ICD-10-CM

## 2024-04-12 DIAGNOSIS — E1159 Type 2 diabetes mellitus with other circulatory complications: Secondary | ICD-10-CM

## 2024-04-12 DIAGNOSIS — M1A09X0 Idiopathic chronic gout, multiple sites, without tophus (tophi): Secondary | ICD-10-CM

## 2024-04-12 DIAGNOSIS — I152 Hypertension secondary to endocrine disorders: Secondary | ICD-10-CM | POA: Diagnosis not present

## 2024-04-12 DIAGNOSIS — E1169 Type 2 diabetes mellitus with other specified complication: Secondary | ICD-10-CM | POA: Diagnosis not present

## 2024-04-12 DIAGNOSIS — M1000 Idiopathic gout, unspecified site: Secondary | ICD-10-CM | POA: Diagnosis not present

## 2024-04-12 DIAGNOSIS — E119 Type 2 diabetes mellitus without complications: Secondary | ICD-10-CM

## 2024-04-12 DIAGNOSIS — Z794 Long term (current) use of insulin: Secondary | ICD-10-CM | POA: Diagnosis not present

## 2024-04-12 DIAGNOSIS — R42 Dizziness and giddiness: Secondary | ICD-10-CM

## 2024-04-12 MED ORDER — METHYLPREDNISOLONE 4 MG PO TBPK: ORAL_TABLET | ORAL | 0 refills | Status: AC

## 2024-04-12 NOTE — Progress Notes (Signed)
 "  Established Patient Office Visit  Subjective:  Patient ID: Lauren Mueller, female    DOB: 07/26/1955  Age: 69 y.o. MRN: [id number]  No chief complaint on file.   Patient comes in for her follow-up today.  She is generally feeling well but complains of severe pain in her left hip.  It is very stiff in the morning when she wakes up and has difficulty putting weight on it.  Currently she has taken Tylenol  to help with pain.  She has history of gout and is currently taking allopurinol  as well as colchicine .  Will check x-rays of her left hip and refer to orthopedics. Patient is taking all her medications regularly now.  She has lost weight on Ozempic  2 mg/week and is tolerating it well.  Denies nausea or vomiting, no abdominal pain, no constipation. Her glucose from her Dexcom is 104.  Her hemoglobin A1c has improved and is due today for blood work we will check.    No other concerns at this time.   Past Medical History:  Diagnosis Date   Anxiety    Arthritis    Depression    Diabetes mellitus, type 2 (HCC) 01/09/2014   DKA, type 2 (HCC) 09/14/2016   Hyperlipidemia associated with type 2 diabetes mellitus (HCC)    Hypertension associated with diabetes (HCC)    Type 2 diabetes mellitus treated with insulin  (HCC)     Past Surgical History:  Procedure Laterality Date   ABDOMINAL HYSTERECTOMY     CESAREAN SECTION     x3   HERNIA REPAIR      Social History   Socioeconomic History   Marital status: Married    Spouse name: Not on file   Number of children: 5   Years of education: 13   Highest education level: Some college, no degree  Occupational History   Occupation: unemployed  Tobacco Use   Smoking status: Never   Smokeless tobacco: Never  Vaping Use   Vaping status: Never Used  Substance and Sexual Activity   Alcohol use: No    Alcohol/week: 0.0 standard drinks of alcohol   Drug use: No   Sexual activity: Not on file  Other Topics Concern   Not on file  Social  History Narrative   PHQ 9 administered to patient score of 12. At this time medication is not recommended and follow up scheduled with Powell Pesa LCSW scheduled on January 3rd at 10:30 am.       GAD 7 administered to the patient score of 11. Follow up for therapy in two weeks.       Social Determinants tool administered with the patient: finances are a strain. Patient has applied for disability and asked about SSI. Follow up in two weeks, resources for SSI check list to be provided along with options for the application process.    Social Drivers of Health   Tobacco Use: Low Risk (04/12/2024)   Patient History    Smoking Tobacco Use: Never    Smokeless Tobacco Use: Never    Passive Exposure: Not on file  Financial Resource Strain: Low Risk  (08/24/2023)   Received from [facility]   Overall Financial Resource Strain (CARDIA)    Difficulty of Paying Living Expenses: Not hard at all  Food Insecurity: No Food Insecurity (08/24/2023)   Received from [facility]   Epic    Within the past 12 months, you worried that your food would run out before you  got the money to buy more.: Never true    Within the past 12 months, the food you bought just didn't last and you didn't have money to get more.: Never true  Transportation Needs: No Transportation Needs (08/24/2023)   Received from [facility] - Transportation    In the past 12 months, has lack of transportation kept you from medical appointments or from getting medications?: No    Lack of Transportation (Non-Medical): No  Physical Activity: Not on file  Stress: Not on file  Social Connections: Not on file  Intimate Partner Violence: Not on file  Depression (EYV7-0): Not on file  Alcohol Screen: Not on file  Housing: Low Risk  (08/24/2023)   Received from [facility]   Epic    In the last 12 months, was there a time when you were not able to pay the mortgage  or rent on time?: No    In the past 12 months, how many times have you moved where you were living?: 1    At any time in the past 12 months, were you homeless or living in a shelter (including now)?: No  Utilities: Not At Risk (08/24/2023)   Received from [facility] Utilities    Threatened with loss of utilities: No  Health Literacy: Not on file    Family History  Problem Relation Age of Onset   Hypertension Mother    Hypertension Sister    Breast cancer Neg Hx     Allergies[1]  Show/hide medication list[2]  Review of Systems  Constitutional: Negative.  Negative for chills, fever and malaise/fatigue.  HENT: Negative.  Negative for congestion and sore throat.   Eyes: Negative.  Negative for blurred vision and pain.  Respiratory: Negative.  Negative for cough and shortness of breath.   Cardiovascular: Negative.  Negative for chest pain, palpitations and leg swelling.  Gastrointestinal: Negative.  Negative for abdominal pain, blood in stool, constipation, diarrhea, heartburn, melena, nausea and vomiting.  Genitourinary: Negative.  Negative for dysuria, flank pain, frequency and urgency.  Musculoskeletal:  Positive for joint pain (Left Hip). Negative for myalgias.  Skin: Negative.   Neurological: Negative.  Negative for dizziness, tingling, sensory change, weakness and headaches.  Endo/Heme/Allergies: Negative.   Psychiatric/Behavioral: Negative.  Negative for depression and suicidal ideas. The patient is not nervous/anxious.        Objective:   BP 128/79   Pulse 82   Ht 5' 4 (1.626 m)   Wt 177 lb (80.3 kg)   BMI 30.38 kg/m   Vitals:   04/12/24 1310  BP: 128/79  Pulse: 82  Height: 5' 4 (1.626 m)  Weight: 177 lb (80.3 kg)  BMI (Calculated): 30.37    Physical Exam Vitals and nursing note reviewed.  Constitutional:      Appearance: Normal appearance.  HENT:     Head: Normocephalic and atraumatic.     Nose: Nose normal.      Mouth/Throat:     Mouth: Mucous membranes are moist.     Pharynx: Oropharynx is clear.  Eyes:     Conjunctiva/sclera: Conjunctivae normal.     Pupils: Pupils are equal, round, and reactive to light.  Cardiovascular:     Rate and Rhythm: Normal rate and regular rhythm.     Pulses: Normal pulses.     Heart sounds: Normal heart sounds. No murmur heard. Pulmonary:     Effort: Pulmonary effort is normal.  Breath sounds: Normal breath sounds. No wheezing.  Abdominal:     General: Bowel sounds are normal.     Palpations: Abdomen is soft.     Tenderness: There is no abdominal tenderness. There is no right CVA tenderness or left CVA tenderness.  Musculoskeletal:        General: Normal range of motion.     Cervical back: Normal range of motion.     Right lower leg: No edema.     Left lower leg: No edema.  Skin:    General: Skin is warm and dry.  Neurological:     General: No focal deficit present.     Mental Status: She is alert and oriented to person, place, and time.  Psychiatric:        Mood and Affect: Mood normal.        Behavior: Behavior normal.      No results found for any visits on 04/12/24.  No results found for this or any previous visit (from the past 2160 hours).    Assessment & Plan:  X-ray of the left hip today.  Sent prescription for prednisone Dosepak.  Orthopedic referral.  Check blood work today. Problem List Items Addressed This Visit       Cardiovascular and Mediastinum   Hypertension associated with diabetes (HCC)     Endocrine   Type 2 diabetes mellitus (HCC)   Relevant Orders   Hemoglobin A1c   Combined hyperlipidemia associated with type 2 diabetes mellitus (HCC)   Relevant Orders   CMP14+EGFR   Lipid Panel w/o Chol/HDL Ratio     Musculoskeletal and Integument   Chronic gout of multiple sites   Relevant Medications   methylPREDNISolone  (MEDROL  DOSEPAK) 4 MG TBPK tablet     Other   Acute idiopathic gout   Relevant Orders   Uric acid    Other Visit Diagnoses       Left hip pain    -  Primary   Relevant Medications   methylPREDNISolone  (MEDROL  DOSEPAK) 4 MG TBPK tablet   Other Relevant Orders   DG Arthro Hip Left   Ambulatory referral to Orthopedic Surgery     Dizziness       Relevant Orders   CBC with Diff       Return in about 2 weeks (around 04/26/2024).   Total time spent: 30 minutes. This time includes review of previous notes and results and patient face to face interaction during today's visit.    FERNAND FREDY RAMAN, MD  04/12/2024   This document may have been prepared by [facility] Voice Recognition software and as such may include unintentional dictation errors.     [1] No Known Allergies [2]  Outpatient Medications Prior to Visit  Medication Sig   acetaminophen  (TYLENOL ) 500 MG tablet Take 500 mg by mouth every 6 (six) hours as needed.   allopurinol  (ZYLOPRIM ) 100 MG tablet TAKE ONE TABLET BY MOUTH TWICE A DAY   allopurinol  (ZYLOPRIM ) 300 MG tablet Take 1 tablet (300 mg total) by mouth daily.   amLODipine  (NORVASC ) 10 MG tablet TAKE ONE TABLET BY MOUTH ONE TIME DAILY   atorvastatin  (LIPITOR) 40 MG tablet TAKE ONE TABLET BY MOUTH ONE TIME DAILY   blood glucose meter kit and supplies KIT Dispense based on patient and insurance preference. Use up to four times daily as directed. (FOR ICD-9 250.00, 250.01).   carvedilol  (COREG ) 25 MG tablet TAKE ONE TABLET BY MOUTH 2 TIMES A DAY WITH A MEAL.  Cholecalciferol (VITAMIN D3) 1.25 MG (50000 UT) CAPS TAKE ONE CAPSULE BY MOUTH ONCE WEEKLY   colchicine  0.6 MG tablet TAKE ONE TABLET BY MOUTH TWICE A DAY   Continuous Glucose Sensor (DEXCOM G7 SENSOR) MISC Inject 1 each into the muscle once. Every 10 days   insulin  lispro (HUMALOG ) 100 UNIT/ML injection PLEASE USE WITH SLIDING SCALE TWICE A DAY. (MAX DOSE OF 12 UNITS FOR BLOOD SUGAR BETWEEN 301-350) (Patient not taking: Reported on 10/24/2023)   lisinopril -hydrochlorothiazide  (ZESTORETIC) 20-12.5 MG tablet TAKE TWO  TABLETS BY MOUTH EVERY MORNING FOR BLOOD PRESSURE   metFORMIN  (GLUCOPHAGE ) 1000 MG tablet TAKE 1 TABLET(1000 MG) BY MOUTH TWICE DAILY WITH A MEAL   pregabalin (LYRICA) 25 MG capsule Take 25 mg by mouth daily. (Patient not taking: Reported on 10/24/2023)   primidone  (MYSOLINE ) 50 MG tablet Take 1 tablet (50 mg total) by mouth at bedtime.   Semaglutide , 1 MG/DOSE, (OZEMPIC , 1 MG/DOSE,) 4 MG/3ML SOPN INJECT 1MG  UNDER THE SKIN ONCE WEEKLY ON THE SAME DAY EACH WEEK   TRESIBA FLEXTOUCH 200 UNIT/ML FlexTouch Pen Inject 50 mLs into the skin daily.   trimethoprim -polymyxin b  (POLYTRIM ) ophthalmic solution Place 2 drops into both eyes every 6 (six) hours.   ULTICARE PEN NEEDLES 31G X 5 MM MISC USE WITH NOVOLOG  PEN TWICE A DAY AS DIRECTED   [DISCONTINUED] methylPREDNISolone  (MEDROL  DOSEPAK) 4 MG TBPK tablet USE AS DIRECTED ON THE PACK   No facility-administered medications prior to visit.   "

## 2024-04-13 LAB — CBC WITH DIFFERENTIAL/PLATELET
Basophils Absolute: 0 x10E3/uL (ref 0.0–0.2)
Basos: 1 %
EOS (ABSOLUTE): 0.1 x10E3/uL (ref 0.0–0.4)
Eos: 1 %
Hematocrit: 40.7 % (ref 34.0–46.6)
Hemoglobin: 12.8 g/dL (ref 11.1–15.9)
Immature Grans (Abs): 0 x10E3/uL (ref 0.0–0.1)
Immature Granulocytes: 0 %
Lymphocytes Absolute: 3.2 x10E3/uL — ABNORMAL HIGH (ref 0.7–3.1)
Lymphs: 43 %
MCH: 27.6 pg (ref 26.6–33.0)
MCHC: 31.4 g/dL — ABNORMAL LOW (ref 31.5–35.7)
MCV: 88 fL (ref 79–97)
Monocytes Absolute: 0.6 x10E3/uL (ref 0.1–0.9)
Monocytes: 9 %
Neutrophils Absolute: 3.5 x10E3/uL (ref 1.4–7.0)
Neutrophils: 46 %
Platelets: 395 x10E3/uL (ref 150–450)
RBC: 4.63 x10E6/uL (ref 3.77–5.28)
RDW: 15.6 % — ABNORMAL HIGH (ref 11.7–15.4)
WBC: 7.5 x10E3/uL (ref 3.4–10.8)

## 2024-04-13 LAB — CMP14+EGFR
ALT: 13 IU/L (ref 0–32)
AST: 18 IU/L (ref 0–40)
Albumin: 4.4 g/dL (ref 3.9–4.9)
Alkaline Phosphatase: 99 IU/L (ref 49–135)
BUN/Creatinine Ratio: 12 (ref 12–28)
BUN: 14 mg/dL (ref 8–27)
Bilirubin Total: 0.4 mg/dL (ref 0.0–1.2)
CO2: 24 mmol/L (ref 20–29)
Calcium: 10.2 mg/dL (ref 8.7–10.3)
Chloride: 101 mmol/L (ref 96–106)
Creatinine, Ser: 1.13 mg/dL — ABNORMAL HIGH (ref 0.57–1.00)
Globulin, Total: 2.5 g/dL (ref 1.5–4.5)
Glucose: 92 mg/dL (ref 70–99)
Potassium: 3.6 mmol/L (ref 3.5–5.2)
Sodium: 143 mmol/L (ref 134–144)
Total Protein: 6.9 g/dL (ref 6.0–8.5)
eGFR: 53 mL/min/1.73 — ABNORMAL LOW

## 2024-04-13 LAB — LIPID PANEL W/O CHOL/HDL RATIO
Cholesterol, Total: 136 mg/dL (ref 100–199)
HDL: 48 mg/dL
LDL Chol Calc (NIH): 66 mg/dL (ref 0–99)
Triglycerides: 126 mg/dL (ref 0–149)
VLDL Cholesterol Cal: 22 mg/dL (ref 5–40)

## 2024-04-13 LAB — HEMOGLOBIN A1C
Est. average glucose Bld gHb Est-mCnc: 137 mg/dL
Hgb A1c MFr Bld: 6.4 % — ABNORMAL HIGH (ref 4.8–5.6)

## 2024-04-13 LAB — URIC ACID: Uric Acid: 4.5 mg/dL (ref 3.0–7.2)

## 2024-04-15 ENCOUNTER — Ambulatory Visit: Payer: Self-pay | Admitting: Internal Medicine

## 2024-04-15 DIAGNOSIS — M25552 Pain in left hip: Secondary | ICD-10-CM

## 2024-04-23 ENCOUNTER — Ambulatory Visit
Admission: RE | Admit: 2024-04-23 | Discharge: 2024-04-23 | Disposition: A | Payer: Medicare (Managed Care) | Source: Ambulatory Visit | Attending: Internal Medicine | Admitting: Internal Medicine

## 2024-04-23 ENCOUNTER — Ambulatory Visit
Admission: RE | Admit: 2024-04-23 | Discharge: 2024-04-23 | Disposition: A | Discharge status: Home / Self Care | Payer: Medicare (Managed Care) | Attending: Internal Medicine | Admitting: Internal Medicine

## 2024-04-23 DIAGNOSIS — M25552 Pain in left hip: Secondary | ICD-10-CM | POA: Insufficient documentation

## 2024-04-29 ENCOUNTER — Ambulatory Visit: Payer: Medicare (Managed Care) | Admitting: Internal Medicine

## 2024-04-30 ENCOUNTER — Ambulatory Visit: Payer: Self-pay | Admitting: Internal Medicine

## 2024-04-30 NOTE — Progress Notes (Signed)
 Patient notified
